# Patient Record
Sex: Male | Born: 1955
Health system: Southern US, Community
[De-identification: ages and names within clinical notes are randomized; demographics above are authoritative.]

## PROBLEM LIST (undated history)

## (undated) DIAGNOSIS — T7840XA Allergy, unspecified, initial encounter: Secondary | ICD-10-CM

## (undated) HISTORY — DX: Allergy, unspecified, initial encounter: T78.40XA

---

## 2016-11-25 ENCOUNTER — Observation Stay (HOSPITAL_COMMUNITY): Payer: BLUE CROSS/BLUE SHIELD

## 2016-11-25 ENCOUNTER — Emergency Department (HOSPITAL_COMMUNITY): Payer: BLUE CROSS/BLUE SHIELD

## 2016-11-25 ENCOUNTER — Encounter (HOSPITAL_COMMUNITY): Payer: Self-pay | Admitting: Emergency Medicine

## 2016-11-25 ENCOUNTER — Inpatient Hospital Stay (HOSPITAL_COMMUNITY)
Admission: EM | Admit: 2016-11-25 | Discharge: 2016-11-30 | DRG: 389 | Disposition: A | Discharge status: Home / Self Care | Payer: BLUE CROSS/BLUE SHIELD | Attending: General Surgery | Admitting: General Surgery

## 2016-11-25 DIAGNOSIS — N179 Acute kidney failure, unspecified: Secondary | ICD-10-CM | POA: Diagnosis present

## 2016-11-25 DIAGNOSIS — E876 Hypokalemia: Secondary | ICD-10-CM | POA: Diagnosis present

## 2016-11-25 DIAGNOSIS — K439 Ventral hernia without obstruction or gangrene: Secondary | ICD-10-CM | POA: Diagnosis present

## 2016-11-25 DIAGNOSIS — Z0189 Encounter for other specified special examinations: Secondary | ICD-10-CM

## 2016-11-25 DIAGNOSIS — R739 Hyperglycemia, unspecified: Secondary | ICD-10-CM | POA: Diagnosis present

## 2016-11-25 DIAGNOSIS — R109 Unspecified abdominal pain: Secondary | ICD-10-CM | POA: Diagnosis not present

## 2016-11-25 DIAGNOSIS — E860 Dehydration: Secondary | ICD-10-CM | POA: Diagnosis present

## 2016-11-25 DIAGNOSIS — K56609 Unspecified intestinal obstruction, unspecified as to partial versus complete obstruction: Secondary | ICD-10-CM | POA: Diagnosis not present

## 2016-11-25 LAB — LIPASE, BLOOD: LIPASE: 21 U/L (ref 11–51)

## 2016-11-25 LAB — CBC
HCT: 47.4 % (ref 39.0–52.0)
Hemoglobin: 16.1 g/dL (ref 13.0–17.0)
MCH: 26.9 pg (ref 26.0–34.0)
MCHC: 34 g/dL (ref 30.0–36.0)
MCV: 79.3 fL (ref 78.0–100.0)
PLATELETS: 383 10*3/uL (ref 150–400)
RBC: 5.98 MIL/uL — ABNORMAL HIGH (ref 4.22–5.81)
RDW: 14.1 % (ref 11.5–15.5)
WBC: 6.7 10*3/uL (ref 4.0–10.5)

## 2016-11-25 LAB — COMPREHENSIVE METABOLIC PANEL
ALT: 12 U/L — AB (ref 17–63)
AST: 19 U/L (ref 15–41)
Albumin: 4.8 g/dL (ref 3.5–5.0)
Alkaline Phosphatase: 77 U/L (ref 38–126)
Anion gap: 16 — ABNORMAL HIGH (ref 5–15)
BILIRUBIN TOTAL: 0.8 mg/dL (ref 0.3–1.2)
BUN: 26 mg/dL — ABNORMAL HIGH (ref 6–20)
CHLORIDE: 96 mmol/L — AB (ref 101–111)
CO2: 23 mmol/L (ref 22–32)
CREATININE: 1.4 mg/dL — AB (ref 0.61–1.24)
Calcium: 9.9 mg/dL (ref 8.9–10.3)
GFR, EST NON AFRICAN AMERICAN: 54 mL/min — AB (ref >60)
Glucose, Bld: 171 mg/dL — ABNORMAL HIGH (ref 65–99)
Potassium: 3.7 mmol/L (ref 3.5–5.1)
Sodium: 135 mmol/L (ref 135–145)
TOTAL PROTEIN: 8.4 g/dL — AB (ref 6.5–8.1)

## 2016-11-25 LAB — URINALYSIS, ROUTINE W REFLEX MICROSCOPIC
BILIRUBIN URINE: NEGATIVE
GLUCOSE, UA: NEGATIVE mg/dL
HGB URINE DIPSTICK: NEGATIVE
Ketones, ur: 5 mg/dL — AB
LEUKOCYTES UA: NEGATIVE
NITRITE: NEGATIVE
PH: 5 (ref 5.0–8.0)
Protein, ur: 30 mg/dL — AB
SPECIFIC GRAVITY, URINE: 1.033 — AB (ref 1.005–1.030)

## 2016-11-25 MED ORDER — ENOXAPARIN SODIUM 30 MG/0.3ML ~~LOC~~ SOLN
30.0000 mg | SUBCUTANEOUS | Status: DC
  Administered 2016-11-25 – 2016-11-29 (×5): 30 mg via SUBCUTANEOUS
  Filled 2016-11-25 (×5): qty 0.3

## 2016-11-25 MED ORDER — KCL IN DEXTROSE-NACL 20-5-0.45 MEQ/L-%-% IV SOLN
INTRAVENOUS | Status: DC
  Administered 2016-11-25 – 2016-11-29 (×10): via INTRAVENOUS
  Administered 2016-11-29: 1000 mL via INTRAVENOUS
  Administered 2016-11-29: 07:00:00 via INTRAVENOUS
  Administered 2016-11-30: 1000 mL via INTRAVENOUS
  Filled 2016-11-25 (×15): qty 1000

## 2016-11-25 MED ORDER — IOPAMIDOL (ISOVUE-300) INJECTION 61%
100.0000 mL | Freq: Once | INTRAVENOUS | Status: AC | PRN
  Administered 2016-11-25: 100 mL via INTRAVENOUS

## 2016-11-25 MED ORDER — ONDANSETRON HCL 4 MG/2ML IJ SOLN
4.0000 mg | Freq: Four times a day (QID) | INTRAMUSCULAR | Status: DC | PRN
  Administered 2016-11-26: 4 mg via INTRAVENOUS
  Filled 2016-11-25: qty 2

## 2016-11-25 MED ORDER — IOPAMIDOL (ISOVUE-300) INJECTION 61%
INTRAVENOUS | Status: AC
  Filled 2016-11-25: qty 100

## 2016-11-25 MED ORDER — SODIUM CHLORIDE 0.9 % IV BOLUS (SEPSIS)
1000.0000 mL | Freq: Once | INTRAVENOUS | Status: AC
  Administered 2016-11-25: 1000 mL via INTRAVENOUS

## 2016-11-25 MED ORDER — HYDROMORPHONE HCL 1 MG/ML IJ SOLN: 1.0000 mg | INTRAMUSCULAR | Status: DC | PRN

## 2016-11-25 MED ORDER — DIPHENHYDRAMINE HCL 50 MG/ML IJ SOLN: 25.0000 mg | Freq: Four times a day (QID) | INTRAMUSCULAR | Status: DC | PRN

## 2016-11-25 MED ORDER — DIPHENHYDRAMINE HCL 25 MG PO CAPS: 25.0000 mg | ORAL_CAPSULE | Freq: Four times a day (QID) | ORAL | Status: DC | PRN

## 2016-11-25 MED ORDER — ONDANSETRON 4 MG PO TBDP
4.0000 mg | ORAL_TABLET | Freq: Once | ORAL | Status: AC
  Administered 2016-11-25: 4 mg via ORAL
  Filled 2016-11-25: qty 1

## 2016-11-25 MED ORDER — PANTOPRAZOLE SODIUM 40 MG IV SOLR
40.0000 mg | Freq: Every day | INTRAVENOUS | Status: DC
  Administered 2016-11-25 – 2016-11-29 (×5): 40 mg via INTRAVENOUS
  Filled 2016-11-25 (×5): qty 40

## 2016-11-25 MED ORDER — ONDANSETRON 4 MG PO TBDP: 4.0000 mg | ORAL_TABLET | Freq: Four times a day (QID) | ORAL | Status: DC | PRN

## 2016-11-25 MED ORDER — DIATRIZOATE MEGLUMINE & SODIUM 66-10 % PO SOLN
90.0000 mL | Freq: Once | ORAL | Status: AC
  Administered 2016-11-25: 90 mL via NASOGASTRIC
  Filled 2016-11-25: qty 90

## 2016-11-25 NOTE — ED Triage Notes (Signed)
Patient states that on Wed believes he got food poisoning. Had abd pain and vomiting since yesterday. Patient reports that he is constipated since hasnt had BM since Wed. But patient also reports not able to keep down any food or drink since then either.  Patient reports chills and sweats.

## 2016-11-25 NOTE — H&P (Signed)
[facility] Admission Note  Frank Edwards January 06, 1956  [id number].    Requesting MD: Stark Jock, MD Chief Complaint/Reason for Consult: SBO  HPI:  Frank Edwards is a 61 year old male who presented to [facility] ED with a chief complaint of nausea and vomiting. Patient reports that he started vomiting on Wednesday morning and has been unable to keep food down since that time. He states that his last bowel movement and flatus were on Tuesday evening. He denies associated abdominal pain. He attempted to take Alka-Seltzer without relief. He denies any aggravating factors. He denies a personal history of SBO, abdominal surgery, or cancer. He denies any known medical conditions and denies the use of daily medications. He denies tobacco or alcohol use. He is currently employed as a Administrator.  ED workup: Afebrile, mild sinus tachycardia 115 BPM, CBC within normal limits, blood glucose 171, BUN 26, creatinine 1.4 CT abdomen pelvis was significant for high-grade distal small bowel obstruction at the level of the ileum. Also noted on CT was a fat-containing ventral hernia superior to the umbilicus.   ROS: Review of Systems  Constitutional: Negative for chills and fever.  Gastrointestinal: Positive for constipation, nausea and vomiting. Negative for abdominal pain, blood in stool, diarrhea and melena.  All other systems reviewed and are negative.   No family history on file.  History reviewed. No pertinent past medical history.  History reviewed. No pertinent surgical history.  Social History:  reports that he has never smoked. He has never used smokeless tobacco. He reports that he does not drink alcohol. His drug history is not on file.  Allergies: No Known Allergies   (Not in a hospital admission)  Blood pressure (!) 118/92, pulse (!) 115, temperature 98.1 F (36.7 C), temperature source Oral, resp. rate 18, height 6' (1.829 m), weight 102.1 kg (225 lb), SpO2 100 %. Physical  Exam: Physical Exam  Constitutional: He is oriented to person, place, and time. He appears well-developed and well-nourished. No distress.  HENT:  Head: Normocephalic and atraumatic.  Right Ear: External ear normal.  Left Ear: External ear normal.  Mouth/Throat: Oropharynx is clear and moist.  Eyes: EOM are normal. Right eye exhibits no discharge. Left eye exhibits no discharge. No scleral icterus.  Neck: Normal range of motion. No tracheal deviation present.  Cardiovascular: Normal rate, normal heart sounds and intact distal pulses.   No murmur heard. Tachycardic  Pulmonary/Chest: Effort normal and breath sounds normal. No stridor. No respiratory distress. He has no wheezes.  Abdominal: Soft. Bowel sounds are normal. He exhibits distension. He exhibits no mass. There is no tenderness. There is no rebound and no guarding. A hernia (Soft, reducible ventral hernia superior to the umbilicus) is present.  Musculoskeletal: Normal range of motion. He exhibits no edema, tenderness or deformity.  Neurological: He is alert and oriented to person, place, and time. No sensory deficit.  Skin: Skin is warm and dry. No rash noted. He is not diaphoretic.  Psychiatric: He has a normal mood and affect. His behavior is normal.    Results for orders placed or performed during the hospital encounter of 11/25/16 (from the past 48 hour(s))  Lipase, blood     Status: None   Collection Time: 11/25/16  7:22 AM  Result Value Ref Range   Lipase 21 11 - 51 U/L  Comprehensive metabolic panel     Status: Abnormal   Collection Time: 11/25/16  7:22 AM  Result Value Ref Range   Sodium 135 135 -  145 mmol/L   Potassium 3.7 3.5 - 5.1 mmol/L   Chloride 96 (L) 101 - 111 mmol/L   CO2 23 22 - 32 mmol/L   Glucose, Bld 171 (H) 65 - 99 mg/dL   BUN 26 (H) 6 - 20 mg/dL   Creatinine, Ser 1.40 (H) 0.61 - 1.24 mg/dL   Calcium 9.9 8.9 - 10.3 mg/dL   Total Protein 8.4 (H) 6.5 - 8.1 g/dL   Albumin 4.8 3.5 - 5.0 g/dL   AST 19 15  - 41 U/L   ALT 12 (L) 17 - 63 U/L   Alkaline Phosphatase 77 38 - 126 U/L   Total Bilirubin 0.8 0.3 - 1.2 mg/dL   GFR calc non Af Amer 54 (L) >60 mL/min   GFR calc Af Amer >60 >60 mL/min    Comment: (NOTE) The eGFR has been calculated using the CKD EPI equation. This calculation has not been validated in all clinical situations. eGFR's persistently <60 mL/min signify possible Chronic Kidney Disease.    Anion gap 16 (H) 5 - 15  CBC     Status: Abnormal   Collection Time: 11/25/16  7:22 AM  Result Value Ref Range   WBC 6.7 4.0 - 10.5 K/uL   RBC 5.98 (H) 4.22 - 5.81 MIL/uL   Hemoglobin 16.1 13.0 - 17.0 g/dL   HCT 47.4 39.0 - 52.0 %   MCV 79.3 78.0 - 100.0 fL   MCH 26.9 26.0 - 34.0 pg   MCHC 34.0 30.0 - 36.0 g/dL   RDW 14.1 11.5 - 15.5 %   Platelets 383 150 - 400 K/uL  Urinalysis, Routine w reflex microscopic     Status: Abnormal   Collection Time: 11/25/16  8:25 AM  Result Value Ref Range   Color, Urine AMBER (A) YELLOW    Comment: BIOCHEMICALS MAY BE AFFECTED BY COLOR   APPearance HAZY (A) CLEAR   Specific Gravity, Urine 1.033 (H) 1.005 - 1.030   pH 5.0 5.0 - 8.0   Glucose, UA NEGATIVE NEGATIVE mg/dL   Hgb urine dipstick NEGATIVE NEGATIVE   Bilirubin Urine NEGATIVE NEGATIVE   Ketones, ur 5 (A) NEGATIVE mg/dL   Protein, ur 30 (A) NEGATIVE mg/dL   Nitrite NEGATIVE NEGATIVE   Leukocytes, UA NEGATIVE NEGATIVE   RBC / HPF 0-5 0 - 5 RBC/hpf   WBC, UA 0-5 0 - 5 WBC/hpf   Bacteria, UA FEW (A) NONE SEEN   Squamous Epithelial / LPF 0-5 (A) NONE SEEN   Mucous PRESENT    Hyaline Casts, UA PRESENT    Ct Abdomen Pelvis W Contrast  Result Date: 11/25/2016 CLINICAL DATA:  Abdominal pain and vomiting since yesterday. EXAM: CT ABDOMEN AND PELVIS WITH CONTRAST TECHNIQUE: Multidetector CT imaging of the abdomen and pelvis was performed using the standard protocol following bolus administration of intravenous contrast. CONTRAST:  162m ISOVUE-300 IOPAMIDOL (ISOVUE-300) INJECTION 61%  COMPARISON:  None. FINDINGS: Lower chest: Scattered small parenchymal bands at both lung bases, compatible with mild scarring or atelectasis. Hepatobiliary: Normal liver with no liver mass. Normal gallbladder with no radiopaque cholelithiasis. No biliary ductal dilatation. Common bile duct diameter 5 mm. Pancreas: Normal, with no mass or duct dilation. Spleen: Normal size. No mass. Adrenals/Urinary Tract: Bilateral hypodense adrenal glands with indeterminate densities measuring 1.2 cm on the right (series 2/ image 26) and 1.4 cm on the left (series 2/ image 29). Normal kidneys with no hydronephrosis and no renal mass. Normal bladder. Stomach/Bowel: Mildly distended fluid-filled stomach. There is moderate diffuse dilatation  of the fluid-filled small bowel to the level of the ileum, with a focal small bowel caliber transition in the right abdomen (series 2/ image 56), distal to which the distal and terminal ileum are completely collapsed. No small bowel wall thickening, discrete mass or pneumatosis. Normal appendix. Normal large bowel with no diverticulosis, large bowel wall thickening or pericolonic fat stranding. Vascular/Lymphatic: Normal caliber abdominal aorta. Patent portal, splenic, hepatic and renal veins. No pathologically enlarged lymph nodes in the abdomen or pelvis. Reproductive: Mildly enlarged prostate . Other: No pneumoperitoneum, ascites or focal fluid collection. Small fat containing midline supraumbilical ventral abdominal hernia. Musculoskeletal: No aggressive appearing focal osseous lesions. Moderate thoracolumbar spondylosis. IMPRESSION: 1. CT findings are compatible with a high-grade distal mechanical small bowel obstruction with a focal caliber transition in the right abdomen at the level of the ileum. No evidence of closed loop obstruction. No bowel wall thickening or discrete bowel mass. No evidence of pneumatosis or pneumoperitoneum. Surgical consultation advised. 2. Small fat containing  midline supraumbilical ventral abdominal hernia. 3. Indeterminate bilateral adrenal nodules, probably benign adenomas. Consider a follow-up adrenal protocol CT abdomen without and with IV contrast in 12 months. This recommendation follows ACR consensus guidelines: Management of Incidental Adrenal Masses: A White Paper of the ACR Incidental Findings Committee. J Am Coll Radiol 2017;14:1038-1044. 4. Mildly enlarged prostate. 5. Mild bibasilar scarring versus atelectasis. Electronically Signed   By: Ilona Sorrel M.D.   On: 11/25/2016 11:05   Assessment/Plan SBO 2/2 unknown etiology - no PMH abdominal surgeries, reducible fat-containing ventral hernia present but not obstructing, no personal Hx CA - CT abdomen read as high-grade distal SBO at the level of the ileum - Afebrile, mild sinus tachycardia, no leukocytosis  1. Nasogastric decompression, bowel rest, IV fluids 2. Start small bowel protocol and follow 8 hour delay films 3. PRN pain control 4. Ambulate  AKI - suspect secondary to dehydration, IV fluids and repeat BMET Hyperglycemia- check A1c  FEN: Nothing by mouth, IVF, NG tube ID: None VTE: Lovenox, SCDs     Jill Alexanders, [facility] Surgery 11/25/2016, 12:01 PM Pager: [phone] Consults: [phone] Mon-Fri 7:00 am-4:30 pm Sat-Sun 7:00 am-11:30 am

## 2016-11-25 NOTE — ED Provider Notes (Signed)
[facility] DEPT Provider Note   CSN: [id number] Arrival date & time: 11/25/16  0703     History   Chief Complaint Chief Complaint  Patient presents with  . Abdominal Pain  . Emesis    HPI Frank Edwards is a 61 y.o. male.  Patient without significant Past medical history present with persistent emesis that began Wednesday morning. Patient reports he has a normal appetite, however will vomit after any meals or drink of water. No improvement in symptoms with Alka-Seltzer. Vomiting is associated with diffuse crampy abdominal pain. He reports last bowel movement was Tuesday, he has not passed gas since Tuesday. Denies history of abdominal surgery, hx CA, chronic medical problems.. Denies daily medications.      History reviewed. No pertinent past medical history.  Patient Active Problem List   Diagnosis Date Noted  . SBO (small bowel obstruction) ([place]) 11/25/2016    History reviewed. No pertinent surgical history.     Home Medications    Prior to Admission medications   Medication Sig Start Date End Date Taking? Authorizing Provider  Aspirin-Acetaminophen-Caffeine (EXCEDRIN EXTRA STRENGTH PO) Take 2 tablets by mouth every 6 (six) hours as needed (For pain.).   Yes [provider]  B Complex Vitamins (VITAMIN B COMPLEX PO) Take 1 tablet by mouth every 7 (seven) days.   Yes [provider]  Multiple Vitamin (MULTIVITAMIN WITH MINERALS) TABS tablet Take 1 tablet by mouth 2 (two) times a week.   Yes [provider]  Tetrahydrozoline-Zn Sulfate (ALLERGY RELIEF EYE DROPS OP) Place 2 drops into both eyes as needed (For eye irritation due to allergies.).   Yes [provider]    Family History No family history on file.  Social History Social History  Substance Use Topics  . Smoking status: Never Smoker  . Smokeless tobacco: Never Used  . Alcohol use No     Allergies   Patient has no known allergies.   Review of  Systems Review of Systems  Constitutional: Negative for fever.  HENT: Negative for trouble swallowing.   Respiratory: Negative for shortness of breath.   Cardiovascular: Negative for chest pain.  Gastrointestinal: Positive for abdominal pain, nausea and vomiting. Negative for diarrhea.  Genitourinary: Negative for dysuria, flank pain and frequency.  Musculoskeletal: Negative for back pain.  Skin: Negative for color change.  Allergic/Immunologic: Negative for immunocompromised state.  Neurological: Negative for headaches.     Physical Exam Updated Vital Signs BP (!) 118/92 (BP Location: Right Arm)   Pulse (!) 115   Temp 98.1 F (36.7 C) (Oral)   Resp 18   Ht 6' (1.829 m)   Wt 102.1 kg (225 lb)   SpO2 100%   BMI 30.52 kg/m   Physical Exam  Constitutional: He appears well-developed and well-nourished.  Pt not in distress  HENT:  Head: Normocephalic and atraumatic.  Mouth/Throat: Oropharynx is clear and moist.  Eyes: Conjunctivae are normal.  Neck: Normal range of motion.  Cardiovascular: Regular rhythm, normal heart sounds and intact distal pulses.  Exam reveals no friction rub.   No murmur heard. Slightly tachycardic  Pulmonary/Chest: Effort normal and breath sounds normal. No respiratory distress. He has no wheezes. He has no rales.  Abdominal: Soft. Normal appearance. Bowel sounds are decreased. There is no tenderness. There is no rebound, no guarding, no CVA tenderness and no tenderness at McBurney's point. A hernia is present. Hernia confirmed positive in the ventral area (just superior to umbilicus).  Neurological: He is alert.  Skin:  Skin is warm.  Psychiatric: He has a normal mood and affect. His behavior is normal.  Nursing note and vitals reviewed.    ED Treatments / Results  Labs (all labs ordered are listed, but only abnormal results are displayed) Labs Reviewed  COMPREHENSIVE METABOLIC PANEL - Abnormal; Notable for the following:       Result Value    Chloride 96 (*)    Glucose, Bld 171 (*)    BUN 26 (*)    Creatinine, Ser 1.40 (*)    Total Protein 8.4 (*)    ALT 12 (*)    GFR calc non Af Amer 54 (*)    Anion gap 16 (*)    All other components within normal limits  CBC - Abnormal; Notable for the following:    RBC 5.98 (*)    All other components within normal limits  URINALYSIS, ROUTINE W REFLEX MICROSCOPIC - Abnormal; Notable for the following:    Color, Urine AMBER (*)    APPearance HAZY (*)    Specific Gravity, Urine 1.033 (*)    Ketones, ur 5 (*)    Protein, ur 30 (*)    Bacteria, UA FEW (*)    Squamous Epithelial / LPF 0-5 (*)    All other components within normal limits  LIPASE, BLOOD  HIV ANTIBODY (ROUTINE TESTING)    EKG  EKG Interpretation None       Radiology Ct Abdomen Pelvis W Contrast  Result Date: 11/25/2016 CLINICAL DATA:  Abdominal pain and vomiting since yesterday. EXAM: CT ABDOMEN AND PELVIS WITH CONTRAST TECHNIQUE: Multidetector CT imaging of the abdomen and pelvis was performed using the standard protocol following bolus administration of intravenous contrast. CONTRAST:  148mL ISOVUE-300 IOPAMIDOL (ISOVUE-300) INJECTION 61% COMPARISON:  None. FINDINGS: Lower chest: Scattered small parenchymal bands at both lung bases, compatible with mild scarring or atelectasis. Hepatobiliary: Normal liver with no liver mass. Normal gallbladder with no radiopaque cholelithiasis. No biliary ductal dilatation. Common bile duct diameter 5 mm. Pancreas: Normal, with no mass or duct dilation. Spleen: Normal size. No mass. Adrenals/Urinary Tract: Bilateral hypodense adrenal glands with indeterminate densities measuring 1.2 cm on the right (series 2/ image 26) and 1.4 cm on the left (series 2/ image 29). Normal kidneys with no hydronephrosis and no renal mass. Normal bladder. Stomach/Bowel: Mildly distended fluid-filled stomach. There is moderate diffuse dilatation of the fluid-filled small bowel to the level of the ileum, with a  focal small bowel caliber transition in the right abdomen (series 2/ image 56), distal to which the distal and terminal ileum are completely collapsed. No small bowel wall thickening, discrete mass or pneumatosis. Normal appendix. Normal large bowel with no diverticulosis, large bowel wall thickening or pericolonic fat stranding. Vascular/Lymphatic: Normal caliber abdominal aorta. Patent portal, splenic, hepatic and renal veins. No pathologically enlarged lymph nodes in the abdomen or pelvis. Reproductive: Mildly enlarged prostate . Other: No pneumoperitoneum, ascites or focal fluid collection. Small fat containing midline supraumbilical ventral abdominal hernia. Musculoskeletal: No aggressive appearing focal osseous lesions. Moderate thoracolumbar spondylosis. IMPRESSION: 1. CT findings are compatible with a high-grade distal mechanical small bowel obstruction with a focal caliber transition in the right abdomen at the level of the ileum. No evidence of closed loop obstruction. No bowel wall thickening or discrete bowel mass. No evidence of pneumatosis or pneumoperitoneum. Surgical consultation advised. 2. Small fat containing midline supraumbilical ventral abdominal hernia. 3. Indeterminate bilateral adrenal nodules, probably benign adenomas. Consider a follow-up adrenal protocol CT abdomen without and with  IV contrast in 12 months. This recommendation follows ACR consensus guidelines: Management of Incidental Adrenal Masses: A White Paper of the ACR Incidental Findings Committee. J Am Coll Radiol 2017;14:1038-1044. 4. Mildly enlarged prostate. 5. Mild bibasilar scarring versus atelectasis. Electronically Signed   By: Ilona Sorrel M.D.   On: 11/25/2016 11:05    Procedures Procedures (including critical care time)  Medications Ordered in ED Medications  iopamidol (ISOVUE-300) 61 % injection (not administered)  enoxaparin (LOVENOX) injection 30 mg (not administered)  dextrose 5 % and 0.45 % NaCl with KCl  20 mEq/L infusion (not administered)  HYDROmorphone (DILAUDID) injection 1 mg (not administered)  diphenhydrAMINE (BENADRYL) capsule 25 mg (not administered)    Or  diphenhydrAMINE (BENADRYL) injection 25 mg (not administered)  ondansetron (ZOFRAN-ODT) disintegrating tablet 4 mg (not administered)    Or  ondansetron (ZOFRAN) injection 4 mg (not administered)  pantoprazole (PROTONIX) injection 40 mg (not administered)  diatrizoate meglumine-sodium (GASTROGRAFIN) 66-10 % solution 90 mL (not administered)  ondansetron (ZOFRAN-ODT) disintegrating tablet 4 mg (4 mg Oral Given 11/25/16 0718)  sodium chloride 0.9 % bolus 1,000 mL (1,000 mLs Intravenous New Bag/Given 11/25/16 1017)  iopamidol (ISOVUE-300) 61 % injection 100 mL (100 mLs Intravenous Contrast Given 11/25/16 1032)     Initial Impression / Assessment and Plan / ED Course  I have reviewed the triage vital signs and the nursing notes.  Pertinent labs & imaging results that were available during my care of the patient were reviewed by me and considered in my medical decision making (see chart for details).  Clinical Course as of Nov 25 1156  Fri Nov 25, 2016  [place], PA-C, with General Surgery, accepting admission  [JR]    Clinical Course User Index [JR] Russo, [place] N, PA-C    Pt presenting with abdominal pain and emesis, with high-grade distal mechanical small bowel obstruction on CT. Pt not in distress, abdomen nontender. Slightly tachycardic and dehydrated; IVF given. Labs unremarkable. NPO. Will consult general surgery. Melina Modena, PA-C, w General Surgery, saw pt and accepting admission.  Patient discussed with Dr. Tamera Punt.  The patient appears reasonably stabilized for admission considering the current resources, flow, and capabilities available in the ED at this time, and I doubt any other [facility] requiring further screening and/or treatment in the ED prior to admission.    Final Clinical Impressions(s) / ED Diagnoses    Final diagnoses:  Small bowel obstruction [facility])    New Prescriptions New Prescriptions   No medications on file     Russo, [place] N, PA-C 11/25/16 1158    Malvin Johns, MD 11/25/16 1528    Malvin Johns, MD 11/25/16 1528

## 2016-11-25 NOTE — ED Notes (Signed)
Bed: WA16 Expected date:  Expected time:  Means of arrival:  Comments: Triage 1 

## 2016-11-25 NOTE — Progress Notes (Deleted)
[facility] Admission Note  Frank Edwards 02/09/1956  [id number].    Requesting MD: Frank Jock, MD Chief Complaint/Reason for Consult: SBO  HPI:  Mr. Frank Edwards is a 61 year old male who presented to [facility] with a chief complaint of nausea and vomiting. Patient reports that he started vomiting on Wednesday morning and has been unable to keep food down since that time. He states that his last bowel movement and flatus were on Tuesday evening. He denies associated abdominal pain. He attempted to take Alka-Seltzer without relief. He denies any aggravating factors. He denies a personal history of SBO, abdominal surgery, or cancer. He denies any known medical conditions and denies the use of daily medications. He denies tobacco or alcohol use. He is currently employed as a Administrator.  ED workup: Afebrile, mild sinus tachycardia 115 BPM, CBC within normal limits, blood glucose 171, BUN 26, creatinine 1.4 CT abdomen pelvis was significant for high-grade distal small bowel obstruction at the level of the ileum. Also noted on CT was a fat-containing ventral hernia superior to the umbilicus.   ROS: Review of Systems  Constitutional: Negative for chills and fever.  Gastrointestinal: Positive for constipation, nausea and vomiting. Negative for abdominal pain, blood in stool, diarrhea and melena.  All other systems reviewed and are negative.   No family history on file.  History reviewed. No pertinent past medical history.  History reviewed. No pertinent surgical history.  Social History:  reports that he has never smoked. He has never used smokeless tobacco. He reports that he does not drink alcohol. His drug history is not on file.  Allergies: No Known Allergies   (Not in a hospital admission)  Blood pressure (!) 118/92, pulse (!) 115, temperature 98.1 F (36.7 C), temperature source Oral, resp. rate 18, height 6' (1.829 m), weight 102.1 kg (225 lb), SpO2 100 %. Physical  Exam: Physical Exam  Constitutional: He is oriented to person, place, and time. He appears well-developed and well-nourished. No distress.  HENT:  Head: Normocephalic and atraumatic.  Right Ear: External ear normal.  Left Ear: External ear normal.  Mouth/Throat: Oropharynx is clear and moist.  Eyes: EOM are normal. Right eye exhibits no discharge. Left eye exhibits no discharge. No scleral icterus.  Neck: Normal range of motion. No tracheal deviation present.  Cardiovascular: Normal rate, normal heart sounds and intact distal pulses.   No murmur heard. Tachycardic  Pulmonary/Chest: Effort normal and breath sounds normal. No stridor. No respiratory distress. He has no wheezes.  Abdominal: Soft. Bowel sounds are normal. He exhibits distension. He exhibits no mass. There is no tenderness. There is no rebound and no guarding. A hernia (Soft, reducible ventral hernia superior to the umbilicus) is present.  Musculoskeletal: Normal range of motion. He exhibits no edema, tenderness or deformity.  Neurological: He is alert and oriented to person, place, and time. No sensory deficit.  Skin: Skin is warm and dry. No rash noted. He is not diaphoretic.  Psychiatric: He has a normal mood and affect. His behavior is normal.    Results for orders placed or performed during the hospital encounter of 11/25/16 (from the past 48 hour(s))  Lipase, blood     Status: None   Collection Time: 11/25/16  7:22 AM  Result Value Ref Range   Lipase 21 11 - 51 U/L  Comprehensive metabolic panel     Status: Abnormal   Collection Time: 11/25/16  7:22 AM  Result Value Ref Range   Sodium 135 135 -  145 mmol/L   Potassium 3.7 3.5 - 5.1 mmol/L   Chloride 96 (L) 101 - 111 mmol/L   CO2 23 22 - 32 mmol/L   Glucose, Bld 171 (H) 65 - 99 mg/dL   BUN 26 (H) 6 - 20 mg/dL   Creatinine, Ser 1.40 (H) 0.61 - 1.24 mg/dL   Calcium 9.9 8.9 - 10.3 mg/dL   Total Protein 8.4 (H) 6.5 - 8.1 g/dL   Albumin 4.8 3.5 - 5.0 g/dL   AST 19 15  - 41 U/L   ALT 12 (L) 17 - 63 U/L   Alkaline Phosphatase 77 38 - 126 U/L   Total Bilirubin 0.8 0.3 - 1.2 mg/dL   GFR calc non Af Amer 54 (L) >60 mL/min   GFR calc Af Amer >60 >60 mL/min    Comment: (NOTE) The eGFR has been calculated using the CKD EPI equation. This calculation has not been validated in all clinical situations. eGFR's persistently <60 mL/min signify possible Chronic Kidney Disease.    Anion gap 16 (H) 5 - 15  CBC     Status: Abnormal   Collection Time: 11/25/16  7:22 AM  Result Value Ref Range   WBC 6.7 4.0 - 10.5 K/uL   RBC 5.98 (H) 4.22 - 5.81 MIL/uL   Hemoglobin 16.1 13.0 - 17.0 g/dL   HCT 47.4 39.0 - 52.0 %   MCV 79.3 78.0 - 100.0 fL   MCH 26.9 26.0 - 34.0 pg   MCHC 34.0 30.0 - 36.0 g/dL   RDW 14.1 11.5 - 15.5 %   Platelets 383 150 - 400 K/uL  Urinalysis, Routine w reflex microscopic     Status: Abnormal   Collection Time: 11/25/16  8:25 AM  Result Value Ref Range   Color, Urine AMBER (A) YELLOW    Comment: BIOCHEMICALS MAY BE AFFECTED BY COLOR   APPearance HAZY (A) CLEAR   Specific Gravity, Urine 1.033 (H) 1.005 - 1.030   pH 5.0 5.0 - 8.0   Glucose, UA NEGATIVE NEGATIVE mg/dL   Hgb urine dipstick NEGATIVE NEGATIVE   Bilirubin Urine NEGATIVE NEGATIVE   Ketones, ur 5 (A) NEGATIVE mg/dL   Protein, ur 30 (A) NEGATIVE mg/dL   Nitrite NEGATIVE NEGATIVE   Leukocytes, UA NEGATIVE NEGATIVE   RBC / HPF 0-5 0 - 5 RBC/hpf   WBC, UA 0-5 0 - 5 WBC/hpf   Bacteria, UA FEW (A) NONE SEEN   Squamous Epithelial / LPF 0-5 (A) NONE SEEN   Mucous PRESENT    Hyaline Casts, UA PRESENT    Ct Abdomen Pelvis W Contrast  Result Date: 11/25/2016 CLINICAL DATA:  Abdominal pain and vomiting since yesterday. EXAM: CT ABDOMEN AND PELVIS WITH CONTRAST TECHNIQUE: Multidetector CT imaging of the abdomen and pelvis was performed using the standard protocol following bolus administration of intravenous contrast. CONTRAST:  142m ISOVUE-300 IOPAMIDOL (ISOVUE-300) INJECTION 61%  COMPARISON:  None. FINDINGS: Lower chest: Scattered small parenchymal bands at both lung bases, compatible with mild scarring or atelectasis. Hepatobiliary: Normal liver with no liver mass. Normal gallbladder with no radiopaque cholelithiasis. No biliary ductal dilatation. Common bile duct diameter 5 mm. Pancreas: Normal, with no mass or duct dilation. Spleen: Normal size. No mass. Adrenals/Urinary Tract: Bilateral hypodense adrenal glands with indeterminate densities measuring 1.2 cm on the right (series 2/ image 26) and 1.4 cm on the left (series 2/ image 29). Normal kidneys with no hydronephrosis and no renal mass. Normal bladder. Stomach/Bowel: Mildly distended fluid-filled stomach. There is moderate diffuse dilatation  of the fluid-filled small bowel to the level of the ileum, with a focal small bowel caliber transition in the right abdomen (series 2/ image 56), distal to which the distal and terminal ileum are completely collapsed. No small bowel wall thickening, discrete mass or pneumatosis. Normal appendix. Normal large bowel with no diverticulosis, large bowel wall thickening or pericolonic fat stranding. Vascular/Lymphatic: Normal caliber abdominal aorta. Patent portal, splenic, hepatic and renal veins. No pathologically enlarged lymph nodes in the abdomen or pelvis. Reproductive: Mildly enlarged prostate . Other: No pneumoperitoneum, ascites or focal fluid collection. Small fat containing midline supraumbilical ventral abdominal hernia. Musculoskeletal: No aggressive appearing focal osseous lesions. Moderate thoracolumbar spondylosis. IMPRESSION: 1. CT findings are compatible with a high-grade distal mechanical small bowel obstruction with a focal caliber transition in the right abdomen at the level of the ileum. No evidence of closed loop obstruction. No bowel wall thickening or discrete bowel mass. No evidence of pneumatosis or pneumoperitoneum. Surgical consultation advised. 2. Small fat containing  midline supraumbilical ventral abdominal hernia. 3. Indeterminate bilateral adrenal nodules, probably benign adenomas. Consider a follow-up adrenal protocol CT abdomen without and with IV contrast in 12 months. This recommendation follows ACR consensus guidelines: Management of Incidental Adrenal Masses: A White Paper of the ACR Incidental Findings Committee. J Am Coll Radiol 2017;14:1038-1044. 4. Mildly enlarged prostate. 5. Mild bibasilar scarring versus atelectasis. Electronically Signed   By: Ilona Sorrel M.D.   On: 11/25/2016 11:05   Assessment/Plan SBO 2/2 unknown etiology - no PMH abdominal surgeries, reducible fat-containing ventral hernia present but not obstructing, no personal Hx CA - CT abdomen read as high-grade distal SBO at the level of the ileum - Afebrile, mild sinus tachycardia, no leukocytosis  1. Nasogastric decompression, bowel rest, IV fluids 2. Start small bowel protocol and follow 8 hour delay films 3. PRN pain control 4. Ambulate  AKI - suspect secondary to dehydration, IV fluids and repeat BMET Hyperglycemia- check A1c  FEN: Nothing by mouth, IVF, NG tube ID: None VTE: Lovenox, SCDs     Jill Alexanders, [facility] 11/25/2016, 12:01 PM Pager: [phone] Consults: [phone] Mon-Fri 7:00 am-4:30 pm Sat-Sun 7:00 am-11:30 am

## 2016-11-26 DIAGNOSIS — E860 Dehydration: Secondary | ICD-10-CM | POA: Diagnosis present

## 2016-11-26 DIAGNOSIS — N179 Acute kidney failure, unspecified: Secondary | ICD-10-CM | POA: Diagnosis present

## 2016-11-26 DIAGNOSIS — E876 Hypokalemia: Secondary | ICD-10-CM | POA: Diagnosis present

## 2016-11-26 DIAGNOSIS — R739 Hyperglycemia, unspecified: Secondary | ICD-10-CM | POA: Diagnosis present

## 2016-11-26 DIAGNOSIS — K56609 Unspecified intestinal obstruction, unspecified as to partial versus complete obstruction: Secondary | ICD-10-CM | POA: Diagnosis present

## 2016-11-26 DIAGNOSIS — R109 Unspecified abdominal pain: Secondary | ICD-10-CM | POA: Diagnosis present

## 2016-11-26 DIAGNOSIS — K439 Ventral hernia without obstruction or gangrene: Secondary | ICD-10-CM | POA: Diagnosis present

## 2016-11-26 LAB — BASIC METABOLIC PANEL
ANION GAP: 8 (ref 5–15)
BUN: 22 mg/dL — ABNORMAL HIGH (ref 6–20)
CALCIUM: 9 mg/dL (ref 8.9–10.3)
CO2: 29 mmol/L (ref 22–32)
CREATININE: 1.11 mg/dL (ref 0.61–1.24)
Chloride: 100 mmol/L — ABNORMAL LOW (ref 101–111)
GLUCOSE: 146 mg/dL — AB (ref 65–99)
Potassium: 3.4 mmol/L — ABNORMAL LOW (ref 3.5–5.1)
Sodium: 137 mmol/L (ref 135–145)

## 2016-11-26 LAB — CBC
HCT: 43.5 % (ref 39.0–52.0)
Hemoglobin: 14.6 g/dL (ref 13.0–17.0)
MCH: 26.9 pg (ref 26.0–34.0)
MCHC: 33.6 g/dL (ref 30.0–36.0)
MCV: 80.3 fL (ref 78.0–100.0)
PLATELETS: 313 10*3/uL (ref 150–400)
RBC: 5.42 MIL/uL (ref 4.22–5.81)
RDW: 14.1 % (ref 11.5–15.5)
WBC: 3.6 10*3/uL — ABNORMAL LOW (ref 4.0–10.5)

## 2016-11-26 LAB — HEMOGLOBIN A1C
HEMOGLOBIN A1C: 6.3 % — AB (ref 4.8–5.6)
MEAN PLASMA GLUCOSE: 134 mg/dL

## 2016-11-26 LAB — HIV ANTIBODY (ROUTINE TESTING W REFLEX): HIV Screen 4th Generation wRfx: NONREACTIVE

## 2016-11-26 MED ORDER — HYDROCORTISONE 2.5 % RE CREA: 1.0000 "application " | TOPICAL_CREAM | Freq: Four times a day (QID) | RECTAL | Status: DC | PRN

## 2016-11-26 MED ORDER — ALUM & MAG HYDROXIDE-SIMETH 200-200-20 MG/5ML PO SUSP: 30.0000 mL | Freq: Four times a day (QID) | ORAL | Status: DC | PRN

## 2016-11-26 MED ORDER — POTASSIUM CHLORIDE 10 MEQ/100ML IV SOLN
10.0000 meq | INTRAVENOUS | Status: AC
  Administered 2016-11-26 (×2): 10 meq via INTRAVENOUS
  Filled 2016-11-26 (×2): qty 100

## 2016-11-26 MED ORDER — LIP MEDEX EX OINT
1.0000 "application " | TOPICAL_OINTMENT | Freq: Two times a day (BID) | CUTANEOUS | Status: DC
  Administered 2016-11-26 – 2016-11-29 (×4): 1 via TOPICAL
  Filled 2016-11-26 (×2): qty 7

## 2016-11-26 MED ORDER — BISACODYL 10 MG RE SUPP: 10.0000 mg | Freq: Two times a day (BID) | RECTAL | Status: DC | PRN

## 2016-11-26 MED ORDER — HYDROCORTISONE 1 % EX CREA: 1.0000 "application " | TOPICAL_CREAM | Freq: Three times a day (TID) | CUTANEOUS | Status: DC | PRN

## 2016-11-26 MED ORDER — GUAIFENESIN-DM 100-10 MG/5ML PO SYRP: 10.0000 mL | ORAL_SOLUTION | ORAL | Status: DC | PRN

## 2016-11-26 MED ORDER — MENTHOL 3 MG MT LOZG: 1.0000 | LOZENGE | OROMUCOSAL | Status: DC | PRN

## 2016-11-26 MED ORDER — MAGIC MOUTHWASH
15.0000 mL | Freq: Four times a day (QID) | ORAL | Status: DC | PRN
  Filled 2016-11-26: qty 15

## 2016-11-26 MED ORDER — PHENOL 1.4 % MT LIQD: 1.0000 | OROMUCOSAL | Status: DC | PRN

## 2016-11-26 MED ORDER — LACTATED RINGERS IV BOLUS (SEPSIS): 1000.0000 mL | Freq: Three times a day (TID) | INTRAVENOUS | Status: AC | PRN

## 2016-11-26 NOTE — Progress Notes (Signed)
Patient ID: Frank Edwards, male   DOB: 1955/08/15, 61 y.o.   MRN: [id number]     Subjective: States he feels better since no vomiting with the NG tube. He has never really had much pain. States he passed flatus couple of times early this morning. Overall more energy.  Objective: Vital signs in last 24 hours: Temp:  [98.9 F (37.2 C)-99.2 F (37.3 C)] 99.2 F (37.3 C) (06/02 0453) Pulse Rate:  [82-102] 82 (06/02 0453) Resp:  [15-19] 16 (06/02 0453) BP: (107-148)/(70-99) 107/70 (06/02 0453) SpO2:  [93 %-99 %] 93 % (06/02 0453) Last BM Date: 11/23/16 (Per pt report)  Intake/Output from previous day: 06/01 0701 - 06/02 0700 In: 1675 [I.V.:1675] Out: 1400 [Urine:550; Emesis/NG output:850] Intake/Output this shift: No intake/output data recorded.  General appearance: alert, cooperative and no distress GI: Mild to moderate distention but soft and nontender. Bowel sounds present.  Lab Results:   Recent Labs  11/25/16 0722 11/26/16 0511  WBC 6.7 3.6*  HGB 16.1 14.6  HCT 47.4 43.5  PLT 383 313   BMET  Recent Labs  11/25/16 0722 11/26/16 0511  NA 135 137  K 3.7 3.4*  CL 96* 100*  CO2 23 29  GLUCOSE 171* 146*  BUN 26* 22*  CREATININE 1.40* 1.11  CALCIUM 9.9 9.0     Studies/Results: Ct Abdomen Pelvis W Contrast  Result Date: 11/25/2016 CLINICAL DATA:  Abdominal pain and vomiting since yesterday. EXAM: CT ABDOMEN AND PELVIS WITH CONTRAST TECHNIQUE: Multidetector CT imaging of the abdomen and pelvis was performed using the standard protocol following bolus administration of intravenous contrast. CONTRAST:  163mL ISOVUE-300 IOPAMIDOL (ISOVUE-300) INJECTION 61% COMPARISON:  None. FINDINGS: Lower chest: Scattered small parenchymal bands at both lung bases, compatible with mild scarring or atelectasis. Hepatobiliary: Normal liver with no liver mass. Normal gallbladder with no radiopaque cholelithiasis. No biliary ductal dilatation. Common bile duct diameter 5 mm. Pancreas:  Normal, with no mass or duct dilation. Spleen: Normal size. No mass. Adrenals/Urinary Tract: Bilateral hypodense adrenal glands with indeterminate densities measuring 1.2 cm on the right (series 2/ image 26) and 1.4 cm on the left (series 2/ image 29). Normal kidneys with no hydronephrosis and no renal mass. Normal bladder. Stomach/Bowel: Mildly distended fluid-filled stomach. There is moderate diffuse dilatation of the fluid-filled small bowel to the level of the ileum, with a focal small bowel caliber transition in the right abdomen (series 2/ image 56), distal to which the distal and terminal ileum are completely collapsed. No small bowel wall thickening, discrete mass or pneumatosis. Normal appendix. Normal large bowel with no diverticulosis, large bowel wall thickening or pericolonic fat stranding. Vascular/Lymphatic: Normal caliber abdominal aorta. Patent portal, splenic, hepatic and renal veins. No pathologically enlarged lymph nodes in the abdomen or pelvis. Reproductive: Mildly enlarged prostate . Other: No pneumoperitoneum, ascites or focal fluid collection. Small fat containing midline supraumbilical ventral abdominal hernia. Musculoskeletal: No aggressive appearing focal osseous lesions. Moderate thoracolumbar spondylosis. IMPRESSION: 1. CT findings are compatible with a high-grade distal mechanical small bowel obstruction with a focal caliber transition in the right abdomen at the level of the ileum. No evidence of closed loop obstruction. No bowel wall thickening or discrete bowel mass. No evidence of pneumatosis or pneumoperitoneum. Surgical consultation advised. 2. Small fat containing midline supraumbilical ventral abdominal hernia. 3. Indeterminate bilateral adrenal nodules, probably benign adenomas. Consider a follow-up adrenal protocol CT abdomen without and with IV contrast in 12 months. This recommendation follows ACR consensus guidelines: Management of Incidental Adrenal Masses:  A White Paper  of the ACR Incidental Findings Committee. J Am Coll Radiol 2017;14:1038-1044. 4. Mildly enlarged prostate. 5. Mild bibasilar scarring versus atelectasis. Electronically Signed   By: Ilona Sorrel M.D.   On: 11/25/2016 11:05   Dg Abd Portable 1v-small Bowel Obstruction Protocol-initial, 8 Hr Delay  Result Date: 11/25/2016 CLINICAL DATA:  Small-bowel obstruction, 8 hour delay status post contrast given at 1300 hours. EXAM: PORTABLE ABDOMEN - 1 VIEW COMPARISON:  None. FINDINGS: Persistent small bowel obstructive pattern with a small amount of gas and fecal residue in the ascending colon. Opacification the urinary bladder from recent CT. Enteric contrast is not clearly identified on these radiographs. Gastric tube is noted in the expected location the stomach. IMPRESSION: Limited visualization of enteric contrast. Persistence of small bowel obstruction without significant change. Electronically Signed   By: Ashley Royalty M.D.   On: 11/25/2016 21:27   Dg Abd Portable 1v-small Bowel Protocol-position Verification  Result Date: 11/25/2016 CLINICAL DATA:  Nasogastric tube placement for small bowel obstruction. EXAM: PORTABLE ABDOMEN - 1 VIEW COMPARISON:  CT of abdomen and pelvis earlier today. FINDINGS: Nasogastric tube extends into the stomach with the tip likely in the fundal region. Bowel gas shows small bowel dilatation. IMPRESSION: Nasogastric tube extends into the fundal region of the stomach. Electronically Signed   By: Aletta Edouard M.D.   On: 11/25/2016 12:24    Anti-infectives: Anti-infectives    None      Assessment/Plan: Distal small bowel obstruction. No history of previous abdominal surgery. KUB yesterday did not really show any contrast in the GI tract but he does have persistent dilated loops of small bowel. Clinically possibly some improvement today with flatus reported and his abdomen although slightly distended is benign. Plan to continue nonoperative management with close observation  today and repeat abdominal x-rays tomorrow. Hypokalemia-replaced KCl Discussed plan with the patient and all questions answered. May still require exploration which I would plan laparoscopically initially if necessary.    LOS: 0 days    Galan Ghee T 11/26/2016

## 2016-11-27 ENCOUNTER — Inpatient Hospital Stay (HOSPITAL_COMMUNITY): Payer: BLUE CROSS/BLUE SHIELD

## 2016-11-27 LAB — BASIC METABOLIC PANEL
Anion gap: 9 (ref 5–15)
BUN: 18 mg/dL (ref 6–20)
CALCIUM: 8.9 mg/dL (ref 8.9–10.3)
CO2: 27 mmol/L (ref 22–32)
CREATININE: 0.91 mg/dL (ref 0.61–1.24)
Chloride: 101 mmol/L (ref 101–111)
GFR calc Af Amer: >60 mL/min (ref >60)
GLUCOSE: 141 mg/dL — AB (ref 65–99)
Potassium: 3.4 mmol/L — ABNORMAL LOW (ref 3.5–5.1)
SODIUM: 137 mmol/L (ref 135–145)

## 2016-11-27 LAB — CBC
HCT: 41.3 % (ref 39.0–52.0)
Hemoglobin: 13.7 g/dL (ref 13.0–17.0)
MCH: 26.7 pg (ref 26.0–34.0)
MCHC: 33.2 g/dL (ref 30.0–36.0)
MCV: 80.5 fL (ref 78.0–100.0)
PLATELETS: 305 10*3/uL (ref 150–400)
RBC: 5.13 MIL/uL (ref 4.22–5.81)
RDW: 14 % (ref 11.5–15.5)
WBC: 3.8 10*3/uL — ABNORMAL LOW (ref 4.0–10.5)

## 2016-11-27 NOTE — Progress Notes (Signed)
Patient ID: Frank Edwards, male   DOB: Feb 28, 1956, 61 y.o.   MRN: [id number]     Subjective: Reports he feels better today. Less bloating. Denies pain. States he passed flatus yesterday but no bowel movement yet. Has been walking in the halls.  Objective: Vital signs in last 24 hours: Temp:  [98.7 F (37.1 C)-99.1 F (37.3 C)] 99.1 F (37.3 C) (06/03 0525) Pulse Rate:  [75-96] 75 (06/03 0525) Resp:  [16-18] 18 (06/03 0525) BP: (126-141)/(72-84) 126/84 (06/03 0525) SpO2:  [95 %-99 %] 97 % (06/03 0525) Last BM Date: 11/23/16  Intake/Output from previous day: 06/02 0701 - 06/03 0700 In: 3265.4 [P.O.:40; I.V.:3225.4] Out: 825 [Urine:775; Emesis/NG output:50] Intake/Output this shift: Total I/O In: -  Out: 275 [Urine:275]  General appearance: alert, cooperative and no distress GI: Soft with minimal if any distention. Not tympanitic. Nontender. Soft nontender epigastric hernia as previously noted.  Lab Results:   Recent Labs  11/26/16 0511 11/27/16 0451  WBC 3.6* 3.8*  HGB 14.6 13.7  HCT 43.5 41.3  PLT 313 305   BMET  Recent Labs  11/26/16 0511 11/27/16 0451  NA 137 137  K 3.4* 3.4*  CL 100* 101  CO2 29 27  GLUCOSE 146* 141*  BUN 22* 18  CREATININE 1.11 0.91  CALCIUM 9.0 8.9     Studies/Results: Ct Abdomen Pelvis W Contrast  Result Date: 11/25/2016 CLINICAL DATA:  Abdominal pain and vomiting since yesterday. EXAM: CT ABDOMEN AND PELVIS WITH CONTRAST TECHNIQUE: Multidetector CT imaging of the abdomen and pelvis was performed using the standard protocol following bolus administration of intravenous contrast. CONTRAST:  175mL ISOVUE-300 IOPAMIDOL (ISOVUE-300) INJECTION 61% COMPARISON:  None. FINDINGS: Lower chest: Scattered small parenchymal bands at both lung bases, compatible with mild scarring or atelectasis. Hepatobiliary: Normal liver with no liver mass. Normal gallbladder with no radiopaque cholelithiasis. No biliary ductal dilatation. Common bile duct  diameter 5 mm. Pancreas: Normal, with no mass or duct dilation. Spleen: Normal size. No mass. Adrenals/Urinary Tract: Bilateral hypodense adrenal glands with indeterminate densities measuring 1.2 cm on the right (series 2/ image 26) and 1.4 cm on the left (series 2/ image 29). Normal kidneys with no hydronephrosis and no renal mass. Normal bladder. Stomach/Bowel: Mildly distended fluid-filled stomach. There is moderate diffuse dilatation of the fluid-filled small bowel to the level of the ileum, with a focal small bowel caliber transition in the right abdomen (series 2/ image 56), distal to which the distal and terminal ileum are completely collapsed. No small bowel wall thickening, discrete mass or pneumatosis. Normal appendix. Normal large bowel with no diverticulosis, large bowel wall thickening or pericolonic fat stranding. Vascular/Lymphatic: Normal caliber abdominal aorta. Patent portal, splenic, hepatic and renal veins. No pathologically enlarged lymph nodes in the abdomen or pelvis. Reproductive: Mildly enlarged prostate . Other: No pneumoperitoneum, ascites or focal fluid collection. Small fat containing midline supraumbilical ventral abdominal hernia. Musculoskeletal: No aggressive appearing focal osseous lesions. Moderate thoracolumbar spondylosis. IMPRESSION: 1. CT findings are compatible with a high-grade distal mechanical small bowel obstruction with a focal caliber transition in the right abdomen at the level of the ileum. No evidence of closed loop obstruction. No bowel wall thickening or discrete bowel mass. No evidence of pneumatosis or pneumoperitoneum. Surgical consultation advised. 2. Small fat containing midline supraumbilical ventral abdominal hernia. 3. Indeterminate bilateral adrenal nodules, probably benign adenomas. Consider a follow-up adrenal protocol CT abdomen without and with IV contrast in 12 months. This recommendation follows ACR consensus guidelines: Management of Incidental  Adrenal Masses: A White Paper of the ACR Incidental Findings Committee. J Am Coll Radiol 2017;14:1038-1044. 4. Mildly enlarged prostate. 5. Mild bibasilar scarring versus atelectasis. Electronically Signed   By: Frank Edwards M.D.   On: 11/25/2016 11:05   Dg Abd Portable 1v-small Bowel Obstruction Protocol-initial, 8 Hr Delay  Result Date: 11/25/2016 CLINICAL DATA:  Small-bowel obstruction, 8 hour delay status post contrast given at 1300 hours. EXAM: PORTABLE ABDOMEN - 1 VIEW COMPARISON:  None. FINDINGS: Persistent small bowel obstructive pattern with a small amount of gas and fecal residue in the ascending colon. Opacification the urinary bladder from recent CT. Enteric contrast is not clearly identified on these radiographs. Gastric tube is noted in the expected location the stomach. IMPRESSION: Limited visualization of enteric contrast. Persistence of small bowel obstruction without significant change. Electronically Signed   By: Frank Edwards M.D.   On: 11/25/2016 21:27   Dg Abd Portable 1v-small Bowel Protocol-position Verification  Result Date: 11/25/2016 CLINICAL DATA:  Nasogastric tube placement for small bowel obstruction. EXAM: PORTABLE ABDOMEN - 1 VIEW COMPARISON:  CT of abdomen and pelvis earlier today. FINDINGS: Nasogastric tube extends into the stomach with the tip likely in the fundal region. Bowel gas shows small bowel dilatation. IMPRESSION: Nasogastric tube extends into the fundal region of the stomach. Electronically Signed   By: Frank Edwards M.D.   On: 11/25/2016 12:24    Anti-infectives: Anti-infectives    None      Assessment/Plan: Small bowel obstruction. No history of previous abdominal surgery. Etiology not clear. He seems to be gradually improving on conservative management. This morning's x-ray is still pending. Continue NG suction and bowel rest for now and check x-rays.    LOS: 1 day    Frank Edwards 11/27/2016

## 2016-11-28 ENCOUNTER — Inpatient Hospital Stay (HOSPITAL_COMMUNITY): Payer: BLUE CROSS/BLUE SHIELD

## 2016-11-28 NOTE — Progress Notes (Signed)
[facility] Progress Note     Subjective: CC: SBO Denies abdominal pain. Reports his abdomen feels less distended and is "normal". +flatus. Reports normal-large volume liquid stool last night. Mobilizing. Denies nausea or vomiting.   Objective: Vital signs in last 24 hours: Temp:  [98.3 F (36.8 C)-99.2 F (37.3 C)] 98.7 F (37.1 C) (06/04 0651) Pulse Rate:  [73-81] 81 (06/04 0651) Resp:  [18] 18 (06/04 0651) BP: (134-139)/(78-82) 139/82 (06/04 0651) SpO2:  [99 %-100 %] 100 % (06/04 0651) Last BM Date: 11/27/16  Intake/Output from previous day: 06/03 0701 - 06/04 0700 In: 2859.5 [I.V.:2859.5] Out: 6378 [Urine:1225; Emesis/NG output:600] Intake/Output this shift: No intake/output data recorded.  PE: Gen:  Alert, NAD, pleasant HEENT: NGT in place; 600 cc/24h; EOM's in tact, pupils equal Card:  Regular rate and rhythm, pedal pulses 2+ BL Pulm:  Normal effort, clear to auscultation bilaterally Abd: Soft, non-tender, non-distended, present but hypoactive BS, no HSM appreciated Skin: warm and dry, no rashes  Psych: A&Ox3   Lab Results:   Recent Labs  11/26/16 0511 11/27/16 0451  WBC 3.6* 3.8*  HGB 14.6 13.7  HCT 43.5 41.3  PLT 313 305   BMET  Recent Labs  11/26/16 0511 11/27/16 0451  NA 137 137  K 3.4* 3.4*  CL 100* 101  CO2 29 27  GLUCOSE 146* 141*  BUN 22* 18  CREATININE 1.11 0.91  CALCIUM 9.0 8.9   PT/INR No results for input(s): LABPROT, INR in the last 72 hours. CMP     Component Value Date/Time   NA 137 11/27/2016 0451   K 3.4 (L) 11/27/2016 0451   CL 101 11/27/2016 0451   CO2 27 11/27/2016 0451   GLUCOSE 141 (H) 11/27/2016 0451   BUN 18 11/27/2016 0451   CREATININE 0.91 11/27/2016 0451   CALCIUM 8.9 11/27/2016 0451   PROT 8.4 (H) 11/25/2016 0722   ALBUMIN 4.8 11/25/2016 0722   AST 19 11/25/2016 0722   ALT 12 (L) 11/25/2016 0722   ALKPHOS 77 11/25/2016 0722   BILITOT 0.8 11/25/2016 0722   GFRNONAA >60 11/27/2016 0451   GFRAA >60 11/27/2016 0451   Lipase     Component Value Date/Time   LIPASE 21 11/25/2016 0722       Studies/Results: Dg Abd 2 Views  Result Date: 11/28/2016 CLINICAL DATA:  Small bowel obstruction EXAM: ABDOMEN - 2 VIEW COMPARISON:  11/27/2016 FINDINGS: Nasogastric tube enters the stomach in has its tip in the region of the antrum or pylorus. Dilated fluid and air-filled small intestine again demonstrated consistent with small bowel obstruction. Dilatation may be slightly diminished. No change otherwise. IMPRESSION: Nasogastric tube place. Persistent small bowel obstruction pattern. No free air. Electronically Signed   By: Nelson Chimes M.D.   On: 11/28/2016 06:56   Dg Abd 2 Views  Result Date: 11/27/2016 CLINICAL DATA:  Abdominal distention -follow-up small bowel obstruction. EXAM: ABDOMEN - 2 VIEW COMPARISON:  11/25/2016 CT and radiographs FINDINGS: Dilated fluid and gas-filled small bowel loops are not significantly changed. Little gas is seen within the colon. Bibasilar atelectasis is again noted. The NG tube has been removed. IMPRESSION: No significant change of dilated small bowel loops compatible with small bowel obstruction. NG tube removed. Electronically Signed   By: Margarette Canada M.D.   On: 11/27/2016 09:07    Anti-infectives: Anti-infectives    None     Assessment/Plan SBO 2/2 unknown etiology - no PMH abdominal surgeries, reducible fat-containing ventral hernia present but not obstructing, no  personal Hx CA - CT abdomen 6/1 read as high-grade distal SBO at the level of the ileum - KUB today w/ persistent dilated loops of small bowel - bowel function returning, denies abdominal pain - clinically improving but still dilated on X-ray. Will continue nasogastric decompression for another day. Repeat AXR in AM.    FEN: NPO, IVF, NGT to LIWS  ID: none VTE: Lovenox, SCD's    LOS: 2 days    Jill Alexanders , [facility] 11/28/2016, 9:04 AM Pager:  [phone] Consults: [phone] Mon-Fri 7:00 am-4:30 pm Sat-Sun 7:00 am-11:30 am

## 2016-11-29 ENCOUNTER — Inpatient Hospital Stay (HOSPITAL_COMMUNITY): Payer: BLUE CROSS/BLUE SHIELD

## 2016-11-29 LAB — BASIC METABOLIC PANEL
ANION GAP: 7 (ref 5–15)
BUN: 13 mg/dL (ref 6–20)
CHLORIDE: 104 mmol/L (ref 101–111)
CO2: 25 mmol/L (ref 22–32)
Calcium: 8.7 mg/dL — ABNORMAL LOW (ref 8.9–10.3)
Creatinine, Ser: 0.88 mg/dL (ref 0.61–1.24)
GFR calc non Af Amer: >60 mL/min (ref >60)
Glucose, Bld: 117 mg/dL — ABNORMAL HIGH (ref 65–99)
POTASSIUM: 3.7 mmol/L (ref 3.5–5.1)
SODIUM: 136 mmol/L (ref 135–145)

## 2016-11-29 NOTE — Progress Notes (Signed)
[facility] Progress Note     Subjective: CC: SBO denies abdominal pain or nausea. Reports large BM last night as well as diarrhea this AM. NG tube has been clamped for 2.5 hours without recurrence of abdominal distention or nausea. Patient mobilizing multiple times daily. Urinating without difficulty.   Objective: Vital signs in last 24 hours: Temp:  [98.8 F (37.1 C)-99.7 F (37.6 C)] 98.9 F (37.2 C) (06/05 0607) Pulse Rate:  [64-82] 64 (06/05 0607) Resp:  [16-18] 18 (06/05 0607) BP: (111-132)/(69-82) 111/69 (06/05 0607) SpO2:  [98 %-100 %] 98 % (06/05 0607) Last BM Date: 11/29/16  Intake/Output from previous day: 06/04 0701 - 06/05 0700 In: 4043.8 [I.V.:4043.8] Out: 1050 [Urine:500; Emesis/NG output:550] Intake/Output this shift: No intake/output data recorded.  PE: Gen:  Alert, NAD, pleasant HEENT: NGT in place; ~550 cc/24h; EOM's in tact, pupils equal Card:  Regular rate and rhythm, pedal pulses 2+ BL Pulm:  Normal effort, clear to auscultation bilaterally Abd: Soft, non-tender, non-distended, present but hypoactive BS, no HSM appreciated Skin: warm and dry, no rashes  Psych: A&Ox3   Lab Results:   Recent Labs  11/27/16 0451  WBC 3.8*  HGB 13.7  HCT 41.3  PLT 305   BMET  Recent Labs  11/27/16 0451  NA 137  K 3.4*  CL 101  CO2 27  GLUCOSE 141*  BUN 18  CREATININE 0.91  CALCIUM 8.9   PT/INR No results for input(s): LABPROT, INR in the last 72 hours. CMP     Component Value Date/Time   NA 137 11/27/2016 0451   K 3.4 (L) 11/27/2016 0451   CL 101 11/27/2016 0451   CO2 27 11/27/2016 0451   GLUCOSE 141 (H) 11/27/2016 0451   BUN 18 11/27/2016 0451   CREATININE 0.91 11/27/2016 0451   CALCIUM 8.9 11/27/2016 0451   PROT 8.4 (H) 11/25/2016 0722   ALBUMIN 4.8 11/25/2016 0722   AST 19 11/25/2016 0722   ALT 12 (L) 11/25/2016 0722   ALKPHOS 77 11/25/2016 0722   BILITOT 0.8 11/25/2016 0722   GFRNONAA >60 11/27/2016 0451   GFRAA >60  11/27/2016 0451   Lipase     Component Value Date/Time   LIPASE 21 11/25/2016 0722       Studies/Results: Dg Abd 2 Views  Result Date: 11/28/2016 CLINICAL DATA:  Small bowel obstruction EXAM: ABDOMEN - 2 VIEW COMPARISON:  11/27/2016 FINDINGS: Nasogastric tube enters the stomach in has its tip in the region of the antrum or pylorus. Dilated fluid and air-filled small intestine again demonstrated consistent with small bowel obstruction. Dilatation may be slightly diminished. No change otherwise. IMPRESSION: Nasogastric tube place. Persistent small bowel obstruction pattern. No free air. Electronically Signed   By: Nelson Chimes M.D.   On: 11/28/2016 06:56   Dg Abd 2 Views  Result Date: 11/27/2016 CLINICAL DATA:  Abdominal distention -follow-up small bowel obstruction. EXAM: ABDOMEN - 2 VIEW COMPARISON:  11/25/2016 CT and radiographs FINDINGS: Dilated fluid and gas-filled small bowel loops are not significantly changed. Little gas is seen within the colon. Bibasilar atelectasis is again noted. The NG tube has been removed. IMPRESSION: No significant change of dilated small bowel loops compatible with small bowel obstruction. NG tube removed. Electronically Signed   By: Margarette Canada M.D.   On: 11/27/2016 09:07    Anti-infectives: Anti-infectives    None     Assessment/Plan SBO 2/2 unknown etiology - no PMH abdominal surgeries, reducible fat-containing ventral hernia present but not obstructing, no personal  Hx CA - CT abdomen 6/1 read as high-grade distal SBO at the level of the ileum - KUB today shows improving small bowel gas pattern - having flatus and bowel movements, denies abdominal pain - discontinue NGT and start clear liquids.   FEN: clear liquid diet ID: none VTE: Lovenox, SCD's    LOS: 3 days    Jill Alexanders , [facility] 11/29/2016, 8:02 AM Pager: [phone] Consults: [phone] Mon-Fri 7:00 am-4:30 pm Sat-Sun 7:00 am-11:30 am

## 2016-11-30 NOTE — Discharge Summary (Signed)
[facility] Discharge Summary   Patient ID: Frank Edwards MRN: [id number] DOB/AGE: September 04, 1955 61 y.o.  Admit date: 11/25/2016 Discharge date: 11/30/2016  Discharge Diagnosis Patient Active Problem List   Diagnosis Date Noted  . SBO (small bowel obstruction) ([place]) 11/25/2016    Consultants N/A   Imaging: CT ABD/PELV W/ CONTAST Result Date: 11/25/2016 1. CT findings are compatible with a high-grade distal mechanical small bowel obstruction with a focal caliber transition in the right abdomen at the level of the ileum. No evidence of closed loop obstruction. No bowel wall thickening or discrete bowel mass. No evidence of pneumatosis or pneumoperitoneum. Surgical consultation advised. 2. Small fat containing midline supraumbilical ventral abdominal hernia. 3. Indeterminate bilateral adrenal nodules, probably benign adenomas. Consider a follow-up adrenal protocol CT abdomen without and with IV contrast in 12 months.   Dg Abd 2 Views  Result Date: 11/29/2016 CLINICAL DATA:  Small bowel obstruction, follow-up EXAM: ABDOMEN - 2 VIEW COMPARISON:  11/28/2016 FINDINGS: NG tube coils in the stomach. Improving bowel gas pattern with decreasing small bowel distention. Gas within nondistended large bowel. No free air. IMPRESSION: Improving small bowel obstruction pattern. Electronically Signed   By: Rolm Baptise M.D.   On: 11/29/2016 08:11    Procedures none  Hospital Course:  Frank Edwards is a 61 year old male who presented to [facility] ED with a chief complaint of nausea and vomiting. CT (above) significant for SBO. Patient has no PMH of SBO, abdominal surgeries, bowel-containing hernias, or colon cancer. He was admitted and placed on small bowel protocol. His obstriction resolved with non-operative management. On hospital day #5 the patient was having bowel movements, tolerating a diet, mobilizing, and vitals stable. He was medically stable for discharge home. Recommend  outpatient follow up with a GI physician for further work-up, including colonoscopy, as patient has never received a colonoscopy. Also recommend outpatient follow up with PCP regarding adrenal nodules appreciated on CT and follow up CT scan in one year. These findings and recommendations were discussed with the patient in detail and he voiced understanding.   Physical Exam: Gen: Alert, NAD, pleasant HEENT:EOM's in tact, pupils equal Card: Regular rate and rhythm, pedal pulses 2+ BL Pulm: Normal effort, clear to auscultation bilaterally Abd: Soft, non-tender, non-distended, + BS, no HSM appreciated Skin: warm and dry, no rashes  Psych: A&Ox3   Allergies as of 11/30/2016   No Known Allergies    Allergies as of 11/30/2016   No Known Allergies     Medication List    TAKE these medications   ALLERGY RELIEF EYE DROPS OP Place 2 drops into both eyes as needed (For eye irritation due to allergies.).   EXCEDRIN EXTRA STRENGTH PO Take 2 tablets by mouth every 6 (six) hours as needed (For pain.).   multivitamin with minerals Tabs tablet Take 1 tablet by mouth 2 (two) times a week.   VITAMIN B COMPLEX PO Take 1 tablet by mouth every 7 (seven) days.        Follow-up Information    [facility]. Schedule an appointment as soon as possible for a visit.   Why:  as soon as possible for a visit with a gastroenterologist to receive a screening colonoscopy.       [facility], [place]. Call.   Specialty:  General Surgery Why:  as needed  Contact information: [address] [place] [phone]          Signed: Obie Dredge, [facility] 11/30/2016, 9:13 AM Pager: [phone] Consults: [phone] Mon-Fri 7:00 am-4:30 pm Sat-Sun 7:00 am-11:30 am

## 2016-11-30 NOTE — Discharge Instructions (Signed)
The center for disease control recommends that all men/women should receive a screening colonoscopy at the age of 59 years old. As we are unsure of what caused your bowel obstruction, I recommend scheduling an appointment with a gastroenterologist (GI physician) to schedule a colonoscopy.    Imaging during your hospitalization was significant for nodules on your adrenal glands, these are usually benign and not concerning, but I recommend seeing your primary care provider to arrange a follow up CT scan in one year to make sure the nodules are stable.

## 2017-11-22 IMAGING — DX DG ABDOMEN 2V
2 series · 2 of 2 positions shown · non-contrast
Comparison: 11/28/2016

CLINICAL DATA: Small bowel obstruction, follow-up

EXAM:
ABDOMEN - 2 VIEW

[abdomen erect]
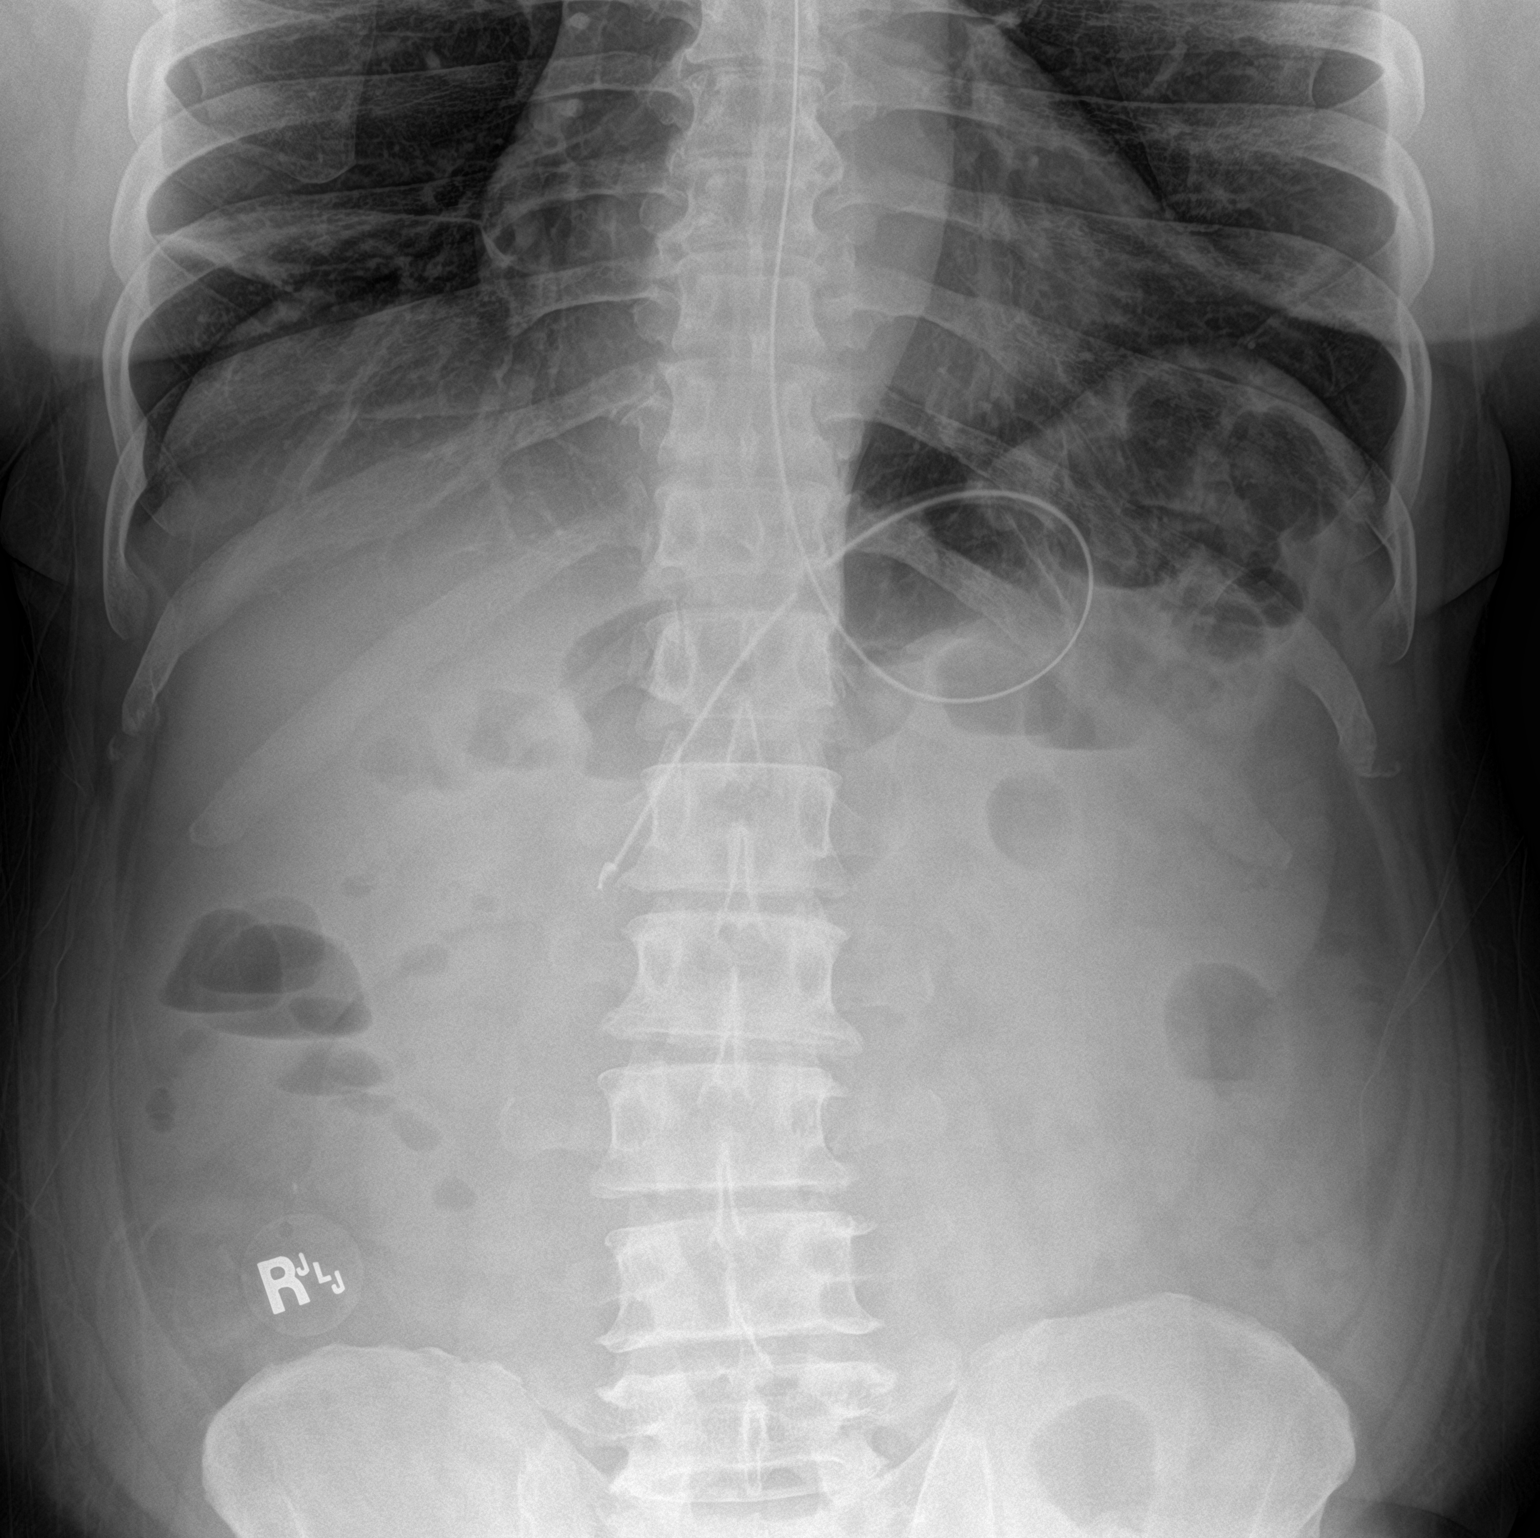

[abdomen supine]
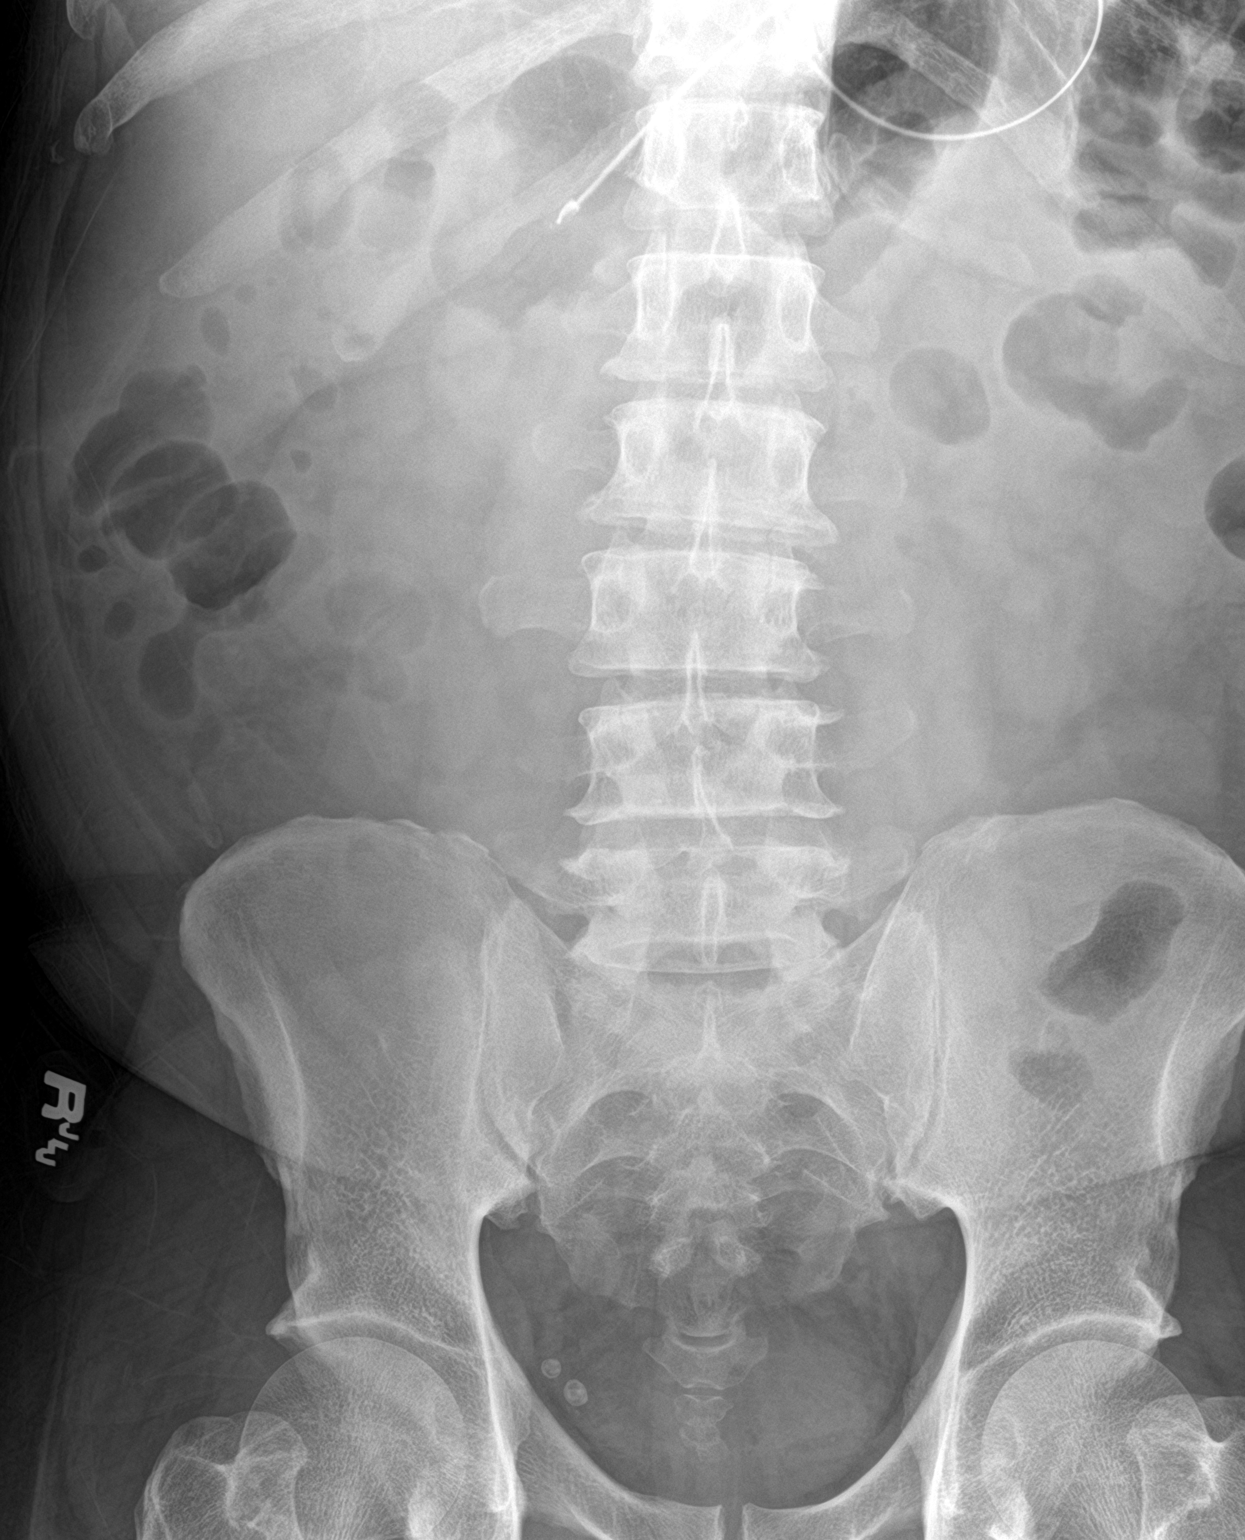

[2 of 2 positions shown; findings below may reference images not displayed]

FINDINGS: NG tube coils in the stomach. Improving bowel gas pattern with
decreasing small bowel distention. Gas within nondistended large
bowel. No free air.
IMPRESSION: Improving small bowel obstruction pattern.

## 2018-04-24 ENCOUNTER — Ambulatory Visit: Payer: BLUE CROSS/BLUE SHIELD | Admitting: Family Medicine

## 2018-04-24 ENCOUNTER — Other Ambulatory Visit: Payer: Self-pay

## 2018-04-24 ENCOUNTER — Encounter: Payer: Self-pay | Admitting: Internal Medicine

## 2018-04-24 ENCOUNTER — Encounter: Payer: Self-pay | Admitting: Family Medicine

## 2018-04-24 VITALS — BP 144/70 | HR 74 | Temp 98.1°F | Ht 72.0 in | Wt 248.0 lb

## 2018-04-24 DIAGNOSIS — Z1211 Encounter for screening for malignant neoplasm of colon: Secondary | ICD-10-CM

## 2018-04-24 DIAGNOSIS — Z131 Encounter for screening for diabetes mellitus: Secondary | ICD-10-CM

## 2018-04-24 DIAGNOSIS — E279 Disorder of adrenal gland, unspecified: Secondary | ICD-10-CM | POA: Diagnosis not present

## 2018-04-24 DIAGNOSIS — Z8719 Personal history of other diseases of the digestive system: Secondary | ICD-10-CM | POA: Diagnosis not present

## 2018-04-24 DIAGNOSIS — R030 Elevated blood-pressure reading, without diagnosis of hypertension: Secondary | ICD-10-CM

## 2018-04-24 DIAGNOSIS — Z113 Encounter for screening for infections with a predominantly sexual mode of transmission: Secondary | ICD-10-CM

## 2018-04-24 DIAGNOSIS — E278 Other specified disorders of adrenal gland: Secondary | ICD-10-CM

## 2018-04-24 NOTE — Patient Instructions (Addendum)
I will refer you to gastroenterologist for follow up of small bowel obstruction and for colon cancer screening.   Adrenal glands had possible nodules on last year CT.  I will order repeat CT as recommended.   I will check some STI testing and hepatitis testing.   Keep a record of your blood pressures outside of the office and if over 140/90, return to discuss possible treatment.    Recheck within next 3 months for a physical.  Thanks for coming in today.   How to Take Your Blood Pressure You can take your blood pressure at home with a machine. You may need to check your blood pressure at home:  To check if you have high blood pressure (hypertension).  To check your blood pressure over time.  To make sure your blood pressure medicine is working.  Supplies needed: You will need a blood pressure machine, or monitor. You can buy one at a drugstore or online. When choosing one:  Choose one with an arm cuff.  Choose one that wraps around your upper arm. Only one finger should fit between your arm and the cuff.  Do not choose one that measures your blood pressure from your wrist or finger.  Your doctor can suggest a monitor. How to prepare Avoid these things for 30 minutes before checking your blood pressure:  Drinking caffeine.  Drinking alcohol.  Eating.  Smoking.  Exercising.  Five minutes before checking your blood pressure:  Pee.  Sit in a dining chair. Avoid sitting in a soft couch or armchair.  Be quiet. Do not talk.  How to take your blood pressure Follow the instructions that came with your machine. If you have a digital blood pressure monitor, these may be the instructions: 1. Sit up straight. 2. Place your feet on the floor. Do not cross your ankles or legs. 3. Rest your left arm at the level of your heart. You may rest it on a table, desk, or chair. 4. Pull up your shirt sleeve. 5. Wrap the blood pressure cuff around the upper part of your left arm. The  cuff should be 1 inch (2.5 cm) above your elbow. It is best to wrap the cuff around bare skin. 6. Fit the cuff snugly around your arm. You should be able to place only one finger between the cuff and your arm. 7. Put the cord inside the groove of your elbow. 8. Press the power button. 9. Sit quietly while the cuff fills with air and loses air. 10. Write down the numbers on the screen. 11. Wait 2-3 minutes and then repeat steps 1-10.  What do the numbers mean? Two numbers make up your blood pressure. The first number is called systolic pressure. The second is called diastolic pressure. An example of a blood pressure reading is "120 over 80" (or 120/80). If you are an adult and do not have a medical condition, use this guide to find out if your blood pressure is normal: Normal  First number: below 120.  Second number: below 80. Elevated  First number: 120-129.  Second number: below 80. Hypertension stage 1  First number: 130-139.  Second number: 80-89. Hypertension stage 2  First number: 140 or above.  Second number: 61 or above. Your blood pressure is above normal even if only the top or bottom number is above normal. Follow these instructions at home:  Check your blood pressure as often as your doctor tells you to.  Take your monitor to your  next doctor's appointment. Your doctor will: ? Make sure you are using it correctly. ? Make sure it is working right.  Make sure you understand what your blood pressure numbers should be.  Tell your doctor if your medicines are causing side effects. Contact a doctor if:  Your blood pressure keeps being high. Get help right away if:  Your first blood pressure number is higher than 180.  Your second blood pressure number is higher than 120. This information is not intended to replace advice given to you by your health care provider. Make sure you discuss any questions you have with your health care provider. Document Released:  05/26/2008 Document Revised: 05/11/2016 Document Reviewed: 11/20/2015 Elsevier Interactive Patient Education  [employer].   If you have lab work done today you will be contacted with your lab results within the next 2 weeks.  If you have not heard from [place] then please contact us. The fastest way to get your results is to register for My Chart.   IF you received an x-ray today, you will receive an invoice from [facility]. Please contact [facility] at [phone] with questions or concerns regarding your invoice.   IF you received labwork today, you will receive an invoice from [place]. Please contact [facility] at [phone] with questions or concerns regarding your invoice.   Our billing staff will not be able to assist you with questions regarding bills from these companies.  You will be contacted with the lab results as soon as they are available. The fastest way to get your results is to activate your My Chart account. Instructions are located on the last page of this paperwork. If you have not heard from [place] regarding the results in 2 weeks, please contact this office.

## 2018-04-24 NOTE — Progress Notes (Signed)
Subjective:  By signing my name below, I, [name], attest that this documentation has been prepared under the direction and in the presence of Merri Ray, MD. Electronically Signed: [name], [place]. 04/24/2018 , 11:43 AM .  Patient was seen in Room 9 .   Patient ID: Frank Edwards, male    DOB: 12-17-1955, 62 y.o.   MRN: [id number] Chief Complaint  Patient presents with  . STD check    request for bloodwork for HIV/AIDS, hep C and hep B     . Establish Care   HPI Frank Edwards is a 62 y.o. male He is a new patient to me. Here to establish care as well as requesting STI testing. He is currently not on prescription medication. Did spend 10-15 minutes chart review prior to seeing patient.   He's originally from [place] [facility] He still has family overseas. He works as a Administrator, driving locally. He ate breakfast this morning; not fasting today.   Immunizations Flu shot was declined today.   SBO Only previous note available was for SBO in 2018. SBO had resolved with non-operative management. Recommended outpatient evaluation with GI along with colonoscopy. He denies following up with GI after the ER visit as he was feeling better. He denies history of having colonoscopy done. He denies nausea, vomiting, abdominal pain or appetite changes.   Bilateral adrenal nodules Bilateral adrenal nodules noted on CT during hospitalization in June 2018. Plan for repeat CT abdomen with and without contrast in 1 year, with probable benign adenomas. Adrenal nodules measured 1.2 cm on the right and 1.4 cm on the left. On CT, noted mildly enlarged prostate.   STI testing Patient notes he's married, same partner monogamous for past 10 years. He denies any extramarital partners. He was watching TV 2 years ago and saw an advertisement about baby boomers requiring Hep C and HIV screening. He had it done in [place], which were negative. But, he wanted vaccinations for  them; however, due to checking over 2 years ago, he was informed requiring screening again. He denies any penile discharge or rash.   Elevated BP His BP was borderline elevated today during triage. He notes his BP usually runs lower.   Patient Active Problem List   Diagnosis Date Noted  . SBO (small bowel obstruction) ([place]) 11/25/2016   No past medical history on file. No past surgical history on file. No Known Allergies Prior to Admission medications   Medication Sig Start Date End Date Taking? Authorizing Provider  Aspirin-Acetaminophen-Caffeine (EXCEDRIN EXTRA STRENGTH PO) Take 2 tablets by mouth every 6 (six) hours as needed (For pain.).    [provider]  B Complex Vitamins (VITAMIN B COMPLEX PO) Take 1 tablet by mouth every 7 (seven) days.    [provider]  Multiple Vitamin (MULTIVITAMIN WITH MINERALS) TABS tablet Take 1 tablet by mouth 2 (two) times a week.    [provider]  Tetrahydrozoline-Zn Sulfate (ALLERGY RELIEF EYE DROPS OP) Place 2 drops into both eyes as needed (For eye irritation due to allergies.).    [provider]   Social History   Socioeconomic History  . Marital status: Married    Spouse name: Not on file  . Number of children: Not on file  . Years of education: Not on file  . Highest education level: Not on file  Occupational History  . Not on file  Social Needs  . Financial resource strain: Not on file  .  Food insecurity:    Worry: Not on file    Inability: Not on file  . Transportation needs:    Medical: Not on file    Non-medical: Not on file  Tobacco Use  . Smoking status: Never Smoker  . Smokeless tobacco: Never Used  Substance and Sexual Activity  . Alcohol use: No  . Drug use: No  . Sexual activity: Not Currently  Lifestyle  . Physical activity:    Days per week: Not on file    Minutes per session: Not on file  . Stress: Not on file  Relationships  . Social connections:    Talks on phone: Not  on file    Gets together: Not on file    Attends religious service: Not on file    Active member of club or organization: Not on file    Attends meetings of clubs or organizations: Not on file    Relationship status: Not on file  . Intimate partner violence:    Fear of current or ex partner: Not on file    Emotionally abused: Not on file    Physically abused: Not on file    Forced sexual activity: Not on file  Other Topics Concern  . Not on file  Social History Narrative  . Not on file    Review of Systems  Constitutional: Negative for fatigue and unexpected weight change.  Eyes: Negative for visual disturbance.  Respiratory: Negative for cough, chest tightness and shortness of breath.   Cardiovascular: Negative for chest pain, palpitations and leg swelling.  Gastrointestinal: Negative for abdominal pain, blood in stool, nausea and vomiting.  Genitourinary: Negative for discharge.  Neurological: Negative for dizziness, light-headedness and headaches.       Objective:   Physical Exam  Constitutional: He is oriented to person, place, and time. He appears well-developed and well-nourished. No distress.  HENT:  Head: Normocephalic and atraumatic.  Eyes: Pupils are equal, round, and reactive to light. EOM are normal.  Neck: Neck supple.  Cardiovascular: Normal rate.  Pulmonary/Chest: Effort normal. No respiratory distress.  Musculoskeletal: Normal range of motion.  Neurological: He is alert and oriented to person, place, and time.  Skin: Skin is warm and dry.  Psychiatric: He has a normal mood and affect. His behavior is normal.  Nursing note and vitals reviewed.   Vitals:   04/24/18 1103 04/24/18 1115  BP: (!) 159/86 (!) 144/70  Pulse: 74   Temp: 98.1 F (36.7 C)   TempSrc: Oral   SpO2: 100%   Weight: 248 lb (112.5 kg)   Height: 6' (1.829 m)        Assessment & Plan:    Frank Edwards is a 62 y.o. male Adrenal nodule ([place]) - Plan: CT Abdomen Pelvis W  Contrast  -Noted on prior CT, possible benign adenoma but will repeat CT to evaluate for stability/changes.  Screening for diabetes mellitus - Plan: Basic metabolic panel  History of small bowel obstruction - Plan: CT Abdomen Pelvis W Contrast, Ambulatory referral to Gastroenterology Screen for colon cancer - Plan: Ambulatory referral to Gastroenterology  -Asymptomatic since prior hospitalization.  Has not had colon cancer screening, will refer to GI for follow-up and screening.   Elevated blood pressure reading  -Borderline, asymptomatic.  Monitor home readings, RTC precautions if elevated.  Routine screening for STI (sexually transmitted infection) - Plan: GC/Chlamydia Probe Amp, HIV Antibody (routine testing w rflx), RPR, Hepatitis B surface antibody,quantitative, Hepatitis B surface antigen, Hepatitis C antibody  -No  known risk factors, request testing including hepatitis testing.  Labs as above.   No orders of the defined types were placed in this encounter.  Patient Instructions   I will refer you to gastroenterologist for follow up of small bowel obstruction and for colon cancer screening.   Adrenal glands had possible nodules on last year CT.  I will order repeat CT as recommended.   I will check some STI testing and hepatitis testing.   Keep a record of your blood pressures outside of the office and if over 140/90, return to discuss possible treatment.    Recheck within next 3 months for a physical.  Thanks for coming in today.   How to Take Your Blood Pressure You can take your blood pressure at home with a machine. You may need to check your blood pressure at home:  To check if you have high blood pressure (hypertension).  To check your blood pressure over time.  To make sure your blood pressure medicine is working.  Supplies needed: You will need a blood pressure machine, or monitor. You can buy one at a drugstore or online. When choosing one:  Choose one with an  arm cuff.  Choose one that wraps around your upper arm. Only one finger should fit between your arm and the cuff.  Do not choose one that measures your blood pressure from your wrist or finger.  Your doctor can suggest a monitor. How to prepare Avoid these things for 30 minutes before checking your blood pressure:  Drinking caffeine.  Drinking alcohol.  Eating.  Smoking.  Exercising.  Five minutes before checking your blood pressure:  Pee.  Sit in a dining chair. Avoid sitting in a soft couch or armchair.  Be quiet. Do not talk.  How to take your blood pressure Follow the instructions that came with your machine. If you have a digital blood pressure monitor, these may be the instructions: 1. Sit up straight. 2. Place your feet on the floor. Do not cross your ankles or legs. 3. Rest your left arm at the level of your heart. You may rest it on a table, desk, or chair. 4. Pull up your shirt sleeve. 5. Wrap the blood pressure cuff around the upper part of your left arm. The cuff should be 1 inch (2.5 cm) above your elbow. It is best to wrap the cuff around bare skin. 6. Fit the cuff snugly around your arm. You should be able to place only one finger between the cuff and your arm. 7. Put the cord inside the groove of your elbow. 8. Press the power button. 9. Sit quietly while the cuff fills with air and loses air. 10. Write down the numbers on the screen. 11. Wait 2-3 minutes and then repeat steps 1-10.  What do the numbers mean? Two numbers make up your blood pressure. The first number is called systolic pressure. The second is called diastolic pressure. An example of a blood pressure reading is "120 over 80" (or 120/80). If you are an adult and do not have a medical condition, use this guide to find out if your blood pressure is normal: Normal  First number: below 120.  Second number: below 80. Elevated  First number: 120-129.  Second number: below 80. Hypertension  stage 1  First number: 130-139.  Second number: 80-89. Hypertension stage 2  First number: 140 or above.  Second number: 65 or above. Your blood pressure is above normal even if only the  top or bottom number is above normal. Follow these instructions at home:  Check your blood pressure as often as your doctor tells you to.  Take your monitor to your next doctor's appointment. Your doctor will: ? Make sure you are using it correctly. ? Make sure it is working right.  Make sure you understand what your blood pressure numbers should be.  Tell your doctor if your medicines are causing side effects. Contact a doctor if:  Your blood pressure keeps being high. Get help right away if:  Your first blood pressure number is higher than 180.  Your second blood pressure number is higher than 120. This information is not intended to replace advice given to you by your health care provider. Make sure you discuss any questions you have with your health care provider. Document Released: 05/26/2008 Document Revised: 05/11/2016 Document Reviewed: 11/20/2015 Elsevier Interactive Patient Education  [employer].   If you have lab work done today you will be contacted with your lab results within the next 2 weeks.  If you have not heard from [place] then please contact us. The fastest way to get your results is to register for My Chart.   IF you received an x-ray today, you will receive an invoice from [facility]. Please contact [facility] at [phone] with questions or concerns regarding your invoice.   IF you received labwork today, you will receive an invoice from [facility]. Please contact [facility] at [phone] with questions or concerns regarding your invoice.   Our billing staff will not be able to assist you with questions regarding bills from these companies.  You will be contacted with the lab results as soon as they are available. The fastest way to get your  results is to activate your My Chart account. Instructions are located on the last page of this paperwork. If you have not heard from [place] regarding the results in 2 weeks, please contact this office.       I personally performed the services described in this documentation, which was scribed in my presence. The recorded information has been reviewed and considered for accuracy and completeness, addended by me as needed, and agree with information above.  Signed,   Merri Ray, MD [facility] [place].  04/26/18 9:25 PM

## 2018-04-25 LAB — BASIC METABOLIC PANEL
BUN / CREAT RATIO: 16 (ref 10–24)
BUN: 15 mg/dL (ref 8–27)
CALCIUM: 9.8 mg/dL (ref 8.6–10.2)
CO2: 19 mmol/L — ABNORMAL LOW (ref 20–29)
Chloride: 102 mmol/L (ref 96–106)
Creatinine, Ser: 0.95 mg/dL (ref 0.76–1.27)
GFR, EST AFRICAN AMERICAN: 100 mL/min/{1.73_m2} (ref >59)
GFR, EST NON AFRICAN AMERICAN: 87 mL/min/{1.73_m2} (ref >59)
Glucose: 77 mg/dL (ref 65–99)
Potassium: 4.3 mmol/L (ref 3.5–5.2)
Sodium: 138 mmol/L (ref 134–144)

## 2018-04-25 LAB — HEPATITIS B SURFACE ANTIGEN: HEP B S AG: NEGATIVE

## 2018-04-25 LAB — RPR: RPR Ser Ql: NONREACTIVE

## 2018-04-25 LAB — GC/CHLAMYDIA PROBE AMP
Chlamydia trachomatis, NAA: NEGATIVE
Neisseria gonorrhoeae by PCR: NEGATIVE

## 2018-04-25 LAB — HIV ANTIBODY (ROUTINE TESTING W REFLEX): HIV SCREEN 4TH GENERATION: NONREACTIVE

## 2018-04-25 LAB — HEPATITIS C ANTIBODY

## 2018-04-25 LAB — HEPATITIS B SURFACE ANTIBODY, QUANTITATIVE: Hepatitis B Surf Ab Quant: 11.6 m[IU]/mL (ref >9.9)

## 2018-05-07 ENCOUNTER — Inpatient Hospital Stay: Admission: RE | Admit: 2018-05-07 | Payer: BLUE CROSS/BLUE SHIELD | Source: Ambulatory Visit

## 2018-05-28 ENCOUNTER — Encounter: Payer: Self-pay | Admitting: Internal Medicine

## 2018-05-28 ENCOUNTER — Ambulatory Visit (AMBULATORY_SURGERY_CENTER): Payer: Self-pay

## 2018-05-28 VITALS — Ht 75.0 in | Wt 245.8 lb

## 2018-05-28 DIAGNOSIS — Z1211 Encounter for screening for malignant neoplasm of colon: Secondary | ICD-10-CM

## 2018-05-28 NOTE — Progress Notes (Signed)
Denies allergies to eggs or soy products. Denies complication of anesthesia or sedation. Denies use of weight loss medication. Denies use of O2.   Emmi instructions declined.   Patient was asked what pharmacy did he use and he said that he doesn't have one.

## 2018-06-11 ENCOUNTER — Encounter: Payer: Self-pay | Admitting: Internal Medicine

## 2018-06-11 ENCOUNTER — Ambulatory Visit (AMBULATORY_SURGERY_CENTER): Payer: BLUE CROSS/BLUE SHIELD | Admitting: Internal Medicine

## 2018-06-11 VITALS — BP 103/67 | HR 60 | Temp 97.8°F | Resp 13 | Ht 75.0 in | Wt 248.0 lb

## 2018-06-11 DIAGNOSIS — K635 Polyp of colon: Secondary | ICD-10-CM | POA: Diagnosis not present

## 2018-06-11 DIAGNOSIS — D123 Benign neoplasm of transverse colon: Secondary | ICD-10-CM

## 2018-06-11 DIAGNOSIS — Z1211 Encounter for screening for malignant neoplasm of colon: Secondary | ICD-10-CM | POA: Diagnosis not present

## 2018-06-11 MED ORDER — SODIUM CHLORIDE 0.9 % IV SOLN: 500.0000 mL | Freq: Once | INTRAVENOUS | Status: AC

## 2018-06-11 NOTE — Patient Instructions (Addendum)
   I found and removed 1 tiny polyp.  I will let you know pathology results and when to have another routine colonoscopy by mail and/or My Chart.  I appreciate the opportunity to care for you. Gatha Mayer, MD, [facility]  Polyp handout given to patient.  YOU HAD AN ENDOSCOPIC PROCEDURE TODAY AT [facility]:   Refer to the procedure report that was given to you for any specific questions about what was found during the examination.  If the procedure report does not answer your questions, please call your gastroenterologist to clarify.  If you requested that your care partner not be given the details of your procedure findings, then the procedure report has been included in a sealed envelope for you to review at your convenience later.  YOU SHOULD EXPECT: Some feelings of bloating in the abdomen. Passage of more gas than usual.  Walking can help get rid of the air that was put into your GI tract during the procedure and reduce the bloating. If you had a lower endoscopy (such as a colonoscopy or flexible sigmoidoscopy) you may notice spotting of blood in your stool or on the toilet paper. If you underwent a bowel prep for your procedure, you may not have a normal bowel movement for a few days.  Please Note:  You might notice some irritation and congestion in your nose or some drainage.  This is from the oxygen used during your procedure.  There is no need for concern and it should clear up in a day or so.  SYMPTOMS TO REPORT IMMEDIATELY:   Following lower endoscopy (colonoscopy or flexible sigmoidoscopy):  Excessive amounts of blood in the stool  Significant tenderness or worsening of abdominal pains  Swelling of the abdomen that is new, acute  Fever of 100F or higher  For urgent or emergent issues, a gastroenterologist can be reached at any hour by calling [phone].   DIET:  We do recommend a small meal at first, but then you may proceed to your regular diet.   Drink plenty of fluids but you should avoid alcoholic beverages for 24 hours.  ACTIVITY:  You should plan to take it easy for the rest of today and you should NOT DRIVE or use heavy machinery until tomorrow (because of the sedation medicines used during the test).    FOLLOW UP: Our staff will call the number listed on your records the next business day following your procedure to check on you and address any questions or concerns that you may have regarding the information given to you following your procedure. If we do not reach you, we will leave a message.  However, if you are feeling well and you are not experiencing any problems, there is no need to return our call.  We will assume that you have returned to your regular daily activities without incident.  If any biopsies were taken you will be contacted by phone or by letter within the next 1-3 weeks.  Please call us at [phone] if you have not heard about the biopsies in 3 weeks.    SIGNATURES/CONFIDENTIALITY: You and/or your care partner have signed paperwork which will be entered into your electronic medical record.  These signatures attest to the fact that that the information above on your After Visit Summary has been reviewed and is understood.  Full responsibility of the confidentiality of this discharge information lies with you and/or your care-partner.

## 2018-06-11 NOTE — Progress Notes (Signed)
Report given to PACU, vss 

## 2018-06-11 NOTE — Op Note (Addendum)
[name] Procedure Date: 06/11/2018 11:34 AM MRN: [id number] Endoscopist: Gatha Mayer , MD Age: 62 Referring MD:  Date of Birth: Jan 31, 1956 Gender: Male Account #: [id number] Procedure:                Colonoscopy Indications:              Screening for colorectal malignant neoplasm, This                            is the patient's first colonoscopy Medicines:                Propofol per Anesthesia, Monitored Anesthesia Care Procedure:                Pre-Anesthesia Assessment:                           - Prior to the procedure, a History and Physical                            was performed, and patient medications and                            allergies were reviewed. The patient's tolerance of                            previous anesthesia was also reviewed. The risks                            and benefits of the procedure and the sedation                            options and risks were discussed with the patient.                            All questions were answered, and informed consent                            was obtained. Prior Anticoagulants: The patient has                            taken no previous anticoagulant or antiplatelet                            agents. ASA Grade Assessment: II - A patient with                            mild systemic disease. After reviewing the risks                            and benefits, the patient was deemed in                            satisfactory condition to undergo the procedure.  After obtaining informed consent, the colonoscope                            was passed under direct vision. Throughout the                            procedure, the patient's blood pressure, pulse, and                            oxygen saturations were monitored continuously. The                            Colonoscope was introduced through the anus and                             advanced to the the cecum, identified by                            appendiceal orifice and ileocecal valve. The                            colonoscopy was performed without difficulty. The                            patient tolerated the procedure well. The quality                            of the bowel preparation was good. The bowel                            preparation used was Miralax. The ileocecal valve,                            appendiceal orifice, and rectum were photographed. Scope In: 11:40:27 AM Scope Out: 11:57:42 AM Scope Withdrawal Time: 0 hours 12 minutes 32 seconds  Total Procedure Duration: 0 hours 17 minutes 15 seconds  Findings:                 The perianal and digital rectal examinations were                            normal. Pertinent negatives include normal prostate                            (size, shape, and consistency).                           A 1 to 2 mm polyp was found in the ascending colon.                            The polyp was sessile. The polyp was removed with a                            cold biopsy forceps. Resection  and retrieval were                            complete. Verification of patient identification                            for the specimen was done. Estimated blood loss was                            minimal.                           The exam was otherwise without abnormality on                            direct and retroflexion views. Complications:            No immediate complications. Estimated Blood Loss:     Estimated blood loss was minimal. Impression:               - One 1 to 2 mm polyp in the ascending colon,                            removed with a cold biopsy forceps. Resected and                            retrieved.                           - The examination was otherwise normal on direct                            and retroflexion views. Recommendation:           - Patient has a contact number available for                             emergencies. The signs and symptoms of potential                            delayed complications were discussed with the                            patient. Return to normal activities tomorrow.                            Written discharge instructions were provided to the                            patient.                           - Resume previous diet.                           - Continue present medications.                           -  Repeat colonoscopy is recommended. The                            colonoscopy date will be determined after pathology                            results from today's exam become available for                            review. Gatha Mayer, MD 06/11/2018 12:01:17 PM This report has been signed electronically.

## 2018-06-11 NOTE — Progress Notes (Signed)
Called to room to assist during endoscopic procedure.  Patient ID and intended procedure confirmed with present staff. Received instructions for my participation in the procedure from the performing physician.  

## 2018-06-11 NOTE — Progress Notes (Signed)
Pt's states no medical or surgical changes since previsit or office visit. 

## 2018-06-11 NOTE — Progress Notes (Signed)
Relief given to [facility] Nathanal Hermiz, vss

## 2018-06-12 ENCOUNTER — Telehealth: Payer: Self-pay | Admitting: *Deleted

## 2018-06-12 NOTE — Telephone Encounter (Signed)
  Follow up Call-  Call back number 06/11/2018  Post procedure Call Back phone  # [phone]  Permission to leave phone message Yes     Patient questions:  Do you have a fever, pain , or abdominal swelling? No. Pain Score  0 *  Have you tolerated food without any problems? Yes.    Have you been able to return to your normal activities? Yes.    Do you have any questions about your discharge instructions: Diet   No. Medications  No. Follow up visit  No.  Do you have questions or concerns about your Care? No.  Actions: * If pain score is 4 or above: No action needed, pain <4.

## 2018-06-15 ENCOUNTER — Encounter: Payer: Self-pay | Admitting: Internal Medicine

## 2018-06-15 NOTE — Progress Notes (Signed)
Not a precanvcerous polyp Recall 10 yrs My Chart

## 2018-08-06 ENCOUNTER — Encounter: Payer: BLUE CROSS/BLUE SHIELD | Admitting: Family Medicine
